# Patient Record
Sex: Male | Born: 1985 | Race: Black or African American | Hispanic: No | Marital: Single | Smoking: Current some day smoker
Health system: Southern US, Community
[De-identification: ages and names within clinical notes are randomized; demographics above are authoritative.]

---
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
## 2008-07-05 ENCOUNTER — Emergency Department (HOSPITAL_COMMUNITY): Admission: EM | Admit: 2008-07-05 | Discharge: 2008-07-05 | Payer: Self-pay | Admitting: Family Medicine

## 2010-09-12 ENCOUNTER — Emergency Department (HOSPITAL_COMMUNITY): Payer: Self-pay

## 2010-09-12 ENCOUNTER — Emergency Department (HOSPITAL_COMMUNITY)
Admission: EM | Admit: 2010-09-12 | Discharge: 2010-09-12 | Disposition: A | Discharge status: Home / Self Care | Payer: Self-pay | Attending: Dermatology | Admitting: Dermatology

## 2010-09-12 DIAGNOSIS — R0602 Shortness of breath: Secondary | ICD-10-CM | POA: Insufficient documentation

## 2010-09-12 DIAGNOSIS — IMO0001 Reserved for inherently not codable concepts without codable children: Secondary | ICD-10-CM | POA: Insufficient documentation

## 2010-09-12 DIAGNOSIS — R079 Chest pain, unspecified: Secondary | ICD-10-CM | POA: Insufficient documentation

## 2010-09-12 DIAGNOSIS — R51 Headache: Secondary | ICD-10-CM | POA: Insufficient documentation

## 2010-09-12 DIAGNOSIS — R070 Pain in throat: Secondary | ICD-10-CM | POA: Insufficient documentation

## 2010-09-12 DIAGNOSIS — B9789 Other viral agents as the cause of diseases classified elsewhere: Secondary | ICD-10-CM | POA: Insufficient documentation

## 2010-09-12 LAB — RAPID STREP SCREEN (MED CTR MEBANE ONLY): Streptococcus, Group A Screen (Direct): NEGATIVE

## 2013-09-13 ENCOUNTER — Emergency Department (HOSPITAL_COMMUNITY)
Admission: EM | Admit: 2013-09-13 | Discharge: 2013-09-13 | Disposition: A | Discharge status: Home / Self Care | Payer: Self-pay | Attending: Emergency Medicine | Admitting: Emergency Medicine

## 2013-09-13 ENCOUNTER — Encounter (HOSPITAL_COMMUNITY): Payer: Self-pay | Admitting: Emergency Medicine

## 2013-09-13 DIAGNOSIS — Z87891 Personal history of nicotine dependence: Secondary | ICD-10-CM | POA: Insufficient documentation

## 2013-09-13 DIAGNOSIS — S025XXB Fracture of tooth (traumatic), initial encounter for open fracture: Secondary | ICD-10-CM

## 2013-09-13 DIAGNOSIS — X58XXXA Exposure to other specified factors, initial encounter: Secondary | ICD-10-CM | POA: Insufficient documentation

## 2013-09-13 DIAGNOSIS — Y939 Activity, unspecified: Secondary | ICD-10-CM | POA: Insufficient documentation

## 2013-09-13 DIAGNOSIS — S025XXA Fracture of tooth (traumatic), initial encounter for closed fracture: Secondary | ICD-10-CM | POA: Insufficient documentation

## 2013-09-13 DIAGNOSIS — Y929 Unspecified place or not applicable: Secondary | ICD-10-CM | POA: Insufficient documentation

## 2013-09-13 MED ORDER — IBUPROFEN 800 MG PO TABS: 800.0000 mg | ORAL_TABLET | Freq: Three times a day (TID) | ORAL | Status: DC

## 2013-09-13 MED ORDER — OXYCODONE-ACETAMINOPHEN 5-325 MG PO TABS: 1.0000 | ORAL_TABLET | ORAL | Status: DC | PRN

## 2013-09-13 MED ORDER — PENICILLIN V POTASSIUM 500 MG PO TABS: 500.0000 mg | ORAL_TABLET | Freq: Four times a day (QID) | ORAL | Status: AC

## 2013-09-13 NOTE — Discharge Instructions (Signed)
Call for a follow up appointment with a Family or Primary Care Provider.  Return if Symptoms worsen.   Take medication as prescribed.   You have a dental injury. Use the resource guide listed below to help you find a dentist if you do not already have one to followup with. It is very important that you get evaluated by a dentist as soon as possible. Call tomorrow to schedule an appointment. Use your pain medication as prescribed and do not operate heavy machinery while on pain medication. Note that your pain medication contains acetaminophen (Tylenol) & its is not reccommended that you use additional acetaminophen (Tylenol) while taking this medication. Take your full course of antibiotics. Read the instructions below.  Eat a soft or liquid diet and rinse your mouth out after meals with warm water. You should see a dentist or return here at once if you have increased swelling, increased pain or uncontrolled bleeding from the site of your injury.   SEEK MEDICAL CARE IF:   You have increased pain not controlled with medicines.   You have swelling around your tooth, in your face or neck.   You have bleeding which starts, continues, or gets worse.   You have a fever >101  If you are unable to open your mouth  RESOURCE GUIDE  Dental Problems  Patients with Medicaid: [facility]                     [facility] [phone]                                           [phone] Phone:  [phone]                                                  Phone:  [phone]  If unable to pay or uninsured, contact:  [facility] or [facility] to become qualified for the adult dental clinic.  Chronic Pain Problems Contact [facility]  [phone] Patients need to be referred by their primary care doctor.  Insufficient Money for Medicine Contact [employer]:  call "211" or [facility] [phone].  No Primary Care Doctor Call  [facility]  [phone] Other agencies that provide inexpensive medical care    [facility]  [phone]    [facility]  [phone]    [facility]  [phone]    [facility]  [phone]    [facility]  [phone]    [facility]  [phone]  [facility]  [phone] [facility]  [phone] [facility]   [phone] (emergency services [phone])  Substance Abuse Resources [facility]  [phone] [facility] [phone] [facility] [phone] [facility] [phone] Residential & Outpatient Substance Abuse Program  [phone]  Abuse/Neglect [facility] Hotline [phone] [facility] Hot[facility] ([phone] (After Hours)  Emergency Shelter [facility] [phone]  [facility] [phone] [facility] [phone]  MRSA Hotline #:   941-427-4334726 367 6565    [facility]  [facility] [place]     [employer]                          [facility] °[address] [place]                       [address]      [address]  °[place]                                                [place]                            [place] °Phone:  [phone]                                   Phone:  [phone]                 Phone:  342-8140 ° °[facility] °Phone:  342-8316 ° °[place] Child Abuse Hotline °([phone] 342-1394 °([phone]) [phone] (After Hours) ° °

## 2013-09-13 NOTE — ED Provider Notes (Signed)
CSN: [id number]     Arrival date & time 09/13/13  1024 History  This chart was scribed for non-physician practitioner, Mellody DrownLauren Toby Ayad, PA-C working with Gilda Creasehristopher J. Pollina, MD by Greggory StallionKayla Andersen, ED scribe. This patient was seen in room TR07C/TR07C and the patient's care was started at 10:51 AM.   Chief Complaint  Patient presents with  . Dental Pain   HPI Comments: Tim Ford is a 28 y.o. male who presents to the Emergency Department complaining of right lower dental pain that started around 4:30 AM today. Denies dental injury but states his tooth broke earlier this morning. Pt has taken ibuprofen and tylenol with no relief. States he has also used orajel with little relief. He does not have a dentist. Denies fever, chills.   The history is provided by the patient. No language interpreter was used.    History reviewed. No pertinent past medical history. History reviewed. No pertinent past surgical history. No family history on file. History  Substance Use Topics  . Smoking status: Former Games developermoker  . Smokeless tobacco: Not on file  . Alcohol Use: No    Review of Systems  Constitutional: Negative for fever and chills.  HENT: Positive for dental problem. Negative for facial swelling.   All other systems reviewed and are negative.  Allergies  Coconut oil; Peanuts; and Sesame seed  Home Medications   Prior to Admission medications   Medication Sig Start Date End Date Taking? Authorizing Provider  acetaminophen (TYLENOL) 500 MG tablet Take 1,000 mg by mouth daily as needed for mild pain.   Yes Historical Provider, MD  benzocaine (ORAJEL) 10 % mucosal gel Use as directed 1 application in the mouth or throat as needed for mouth pain.   Yes Historical Provider, MD  ibuprofen (ADVIL,MOTRIN) 200 MG tablet Take 400 mg by mouth daily as needed for mild pain.   Yes Historical Provider, MD   BP 113/69  Pulse 58  Temp(Src) 98.1 F (36.7 C) (Oral)  SpO2 100%  Physical Exam  Nursing  note and vitals reviewed. Constitutional: He is oriented to person, place, and time. He appears well-developed and well-nourished.  Non-toxic appearance. He does not have a sickly appearance. He does not appear ill. No distress.  HENT:  Head: Normocephalic and atraumatic.  Mouth/Throat: Uvula is midline. No trismus in the jaw.  Tenderness to palpation of tooth # 30,  Large portion of the mesial and occlusal surface missing. Pulp exposed. No signs of peritonsillar or tonsillar abscess. No signs of gingival abscess. Oropharynx is clear and without exudates. Soft non-tender sublingual mucosa, no tongue elevation, no edema to sublingual space, normal voice. Airway patent.  Eyes: Conjunctivae and EOM are normal.  Neck: Neck supple.  Cardiovascular: Normal rate.   Pulmonary/Chest: Effort normal. No respiratory distress.  Musculoskeletal: Normal range of motion.  Neurological: He is alert and oriented to person, place, and time.  Skin: Skin is warm and dry. He is not diaphoretic.  Psychiatric: He has a normal mood and affect. His behavior is normal.    ED Course  Dental Date/Time: 09/13/2013 10:45 AM Performed by: Mellody DrownPARKER, Tarae Wooden Authorized by: Mellody DrownPARKER, Evangelene Vora Consent: Verbal consent obtained. Risks and benefits: risks, benefits and alternatives were discussed Consent given by: patient Patient understanding: patient states understanding of the procedure being performed Patient consent: the patient's understanding of the procedure matches consent given Required items: required blood products, implants, devices, and special equipment available Patient identity confirmed: verbally with patient Local anesthesia used: Pt declined. local  gel. Patient sedated: no Patient tolerance: Patient tolerated the procedure well with no immediate complications. Comments: Paste applied to fractured tooth.   (including critical care time)  COORDINATION OF CARE: 10:52 AM-Discussed treatment plan which includes  dental block with pt at bedside and pt agreed to plan. Will give pt dental referrals and advised him to follow up.   Labs Review Labs Reviewed - No data to display  Imaging Review No results found.   EKG Interpretation None      MDM   Final diagnoses:  Fracture, tooth, open, initial encounter   Patient presents with fracture of tooth, patient declined local anesthetic with dental paste. Patient discharged with antibiotics pain medication and dental references. No gross abscess.  Exam unconcerning for Ludwig's angina or spread of infection.  Meds given in ED:  Medications - No data to display  Discharge Medication List as of 09/13/2013 11:32 AM    START taking these medications   Details  !! ibuprofen (ADVIL,MOTRIN) 800 MG tablet Take 1 tablet (800 mg total) by mouth 3 (three) times daily with meals., Starting 09/13/2013, Until Discontinued, Print    oxyCODONE-acetaminophen (PERCOCET) 5-325 MG per tablet Take 1 tablet by mouth every 4 (four) hours as needed., Starting 09/13/2013, Until Discontinued, Print    penicillin v potassium (VEETID) 500 MG tablet Take 1 tablet (500 mg total) by mouth 4 (four) times daily., Starting 09/13/2013, Last dose on Mon 09/20/13, Print     !! - Potential duplicate medications found. Please discuss with provider.      I personally performed the services described in this documentation, which was scribed in my presence. The recorded information has been reviewed and is accurate.  Mellody Drown, PA-C 09/14/13 [phone]

## 2013-09-13 NOTE — ED Notes (Signed)
Patient states R lower molar broken.  Patient states pain started this morning about 430a.m.   Patient states he took ibuprofen and tylenol at home but didn't help.  Patient states he doesn't have insurance so hasn't been to a dentist.

## 2013-09-16 NOTE — ED Provider Notes (Signed)
Medical screening examination/treatment/procedure(s) were performed by non-physician practitioner and as supervising physician I was immediately available for consultation/collaboration.  Nhan Qualley J. Jaycee Pelzer, MD 09/16/13 1749 

## 2013-12-02 ENCOUNTER — Encounter (HOSPITAL_COMMUNITY): Payer: Self-pay | Admitting: Emergency Medicine

## 2013-12-02 ENCOUNTER — Emergency Department (HOSPITAL_COMMUNITY)
Admission: EM | Admit: 2013-12-02 | Discharge: 2013-12-02 | Disposition: A | Discharge status: Home / Self Care | Payer: Self-pay | Attending: Emergency Medicine | Admitting: Emergency Medicine

## 2013-12-02 DIAGNOSIS — K029 Dental caries, unspecified: Secondary | ICD-10-CM | POA: Insufficient documentation

## 2013-12-02 DIAGNOSIS — K0889 Other specified disorders of teeth and supporting structures: Secondary | ICD-10-CM

## 2013-12-02 DIAGNOSIS — Z720 Tobacco use: Secondary | ICD-10-CM | POA: Insufficient documentation

## 2013-12-02 DIAGNOSIS — G8929 Other chronic pain: Secondary | ICD-10-CM | POA: Insufficient documentation

## 2013-12-02 DIAGNOSIS — K088 Other specified disorders of teeth and supporting structures: Secondary | ICD-10-CM | POA: Insufficient documentation

## 2013-12-02 MED ORDER — AMOXICILLIN 500 MG PO CAPS: 500.0000 mg | ORAL_CAPSULE | Freq: Three times a day (TID) | ORAL | Status: DC

## 2013-12-02 MED ORDER — HYDROCODONE-ACETAMINOPHEN 5-325 MG PO TABS
1.0000 | ORAL_TABLET | Freq: Once | ORAL | Status: AC
  Administered 2013-12-02: 1 via ORAL
  Filled 2013-12-02: qty 1

## 2013-12-02 MED ORDER — HYDROCODONE-ACETAMINOPHEN 5-325 MG PO TABS: 1.0000 | ORAL_TABLET | Freq: Four times a day (QID) | ORAL | Status: DC | PRN

## 2013-12-02 NOTE — ED Provider Notes (Signed)
Medical screening examination/treatment/procedure(s) were performed by non-physician practitioner and as supervising physician I was immediately available for consultation/collaboration.   EKG Interpretation None       Brennah Quraishi M Tynika Luddy, MD 12/02/13 0603 

## 2013-12-02 NOTE — Discharge Instructions (Signed)
Dental Caries °Dental caries (also called tooth decay) is the most common oral disease. It can occur at any age but is more common in children and young adults.  °HOW DENTAL CARIES DEVELOPS  °The process of decay begins when bacteria and foods (particularly sugars and starches) combine in your mouth to produce plaque. Plaque is a substance that sticks to the hard, outer surface of a tooth (enamel). The bacteria in plaque produce acids that attack enamel. These acids may also attack the root surface of a tooth (cementum) if it is exposed. Repeated attacks dissolve these surfaces and create holes in the tooth (cavities). If left untreated, the acids destroy the other layers of the tooth.  °RISK FACTORS °· Frequent sipping of sugary beverages.   °· Frequent snacking on sugary and starchy foods, especially those that easily get stuck in the teeth.   °· Poor oral hygiene.   °· Dry mouth.   °· Substance abuse such as methamphetamine abuse.   °· Broken or poor-fitting dental restorations.   °· Eating disorders.   °· Gastroesophageal reflux disease (GERD).   °· Certain radiation treatments to the head and neck. °SYMPTOMS °In the early stages of dental caries, symptoms are seldom present. Sometimes white, chalky areas may be seen on the enamel or other tooth layers. In later stages, symptoms may include: °· Pits and holes on the enamel. °· Toothache after sweet, hot, or cold foods or drinks are consumed. °· Pain around the tooth. °· Swelling around the tooth. °DIAGNOSIS  °Most of the time, dental caries is detected during a regular dental checkup. A diagnosis is made after a thorough medical and dental history is taken and the surfaces of your teeth are checked for signs of dental caries. Sometimes special instruments, such as lasers, are used to check for dental caries. Dental X-ray exams may be taken so that areas not visible to the eye (such as between the contact areas of the teeth) can be checked for cavities.    °TREATMENT  °If dental caries is in its early stages, it may be reversed with a fluoride treatment or an application of a remineralizing agent at the dental office. Thorough brushing and flossing at home is needed to aid these treatments. If it is in its later stages, treatment depends on the location and extent of tooth destruction:  °· If a small area of the tooth has been destroyed, the destroyed area will be removed and cavities will be filled with a material such as gold, silver amalgam, or composite resin.   °· If a large area of the tooth has been destroyed, the destroyed area will be removed and a cap (crown) will be fitted over the remaining tooth structure.   °· If the center part of the tooth (pulp) is affected, a procedure called a root canal will be needed before a filling or crown can be placed.   °· If most of the tooth has been destroyed, the tooth may need to be pulled (extracted). °HOME CARE INSTRUCTIONS °You can prevent, stop, or reverse dental caries at home by practicing good oral hygiene. Good oral hygiene includes: °· Thoroughly cleaning your teeth at least twice a day with a toothbrush and dental floss.   °· Using a fluoride toothpaste. A fluoride mouth rinse may also be used if recommended by your dentist or health care provider.   °· Restricting the amount of sugary and starchy foods and sugary liquids you consume.   °· Avoiding frequent snacking on these foods and sipping of these liquids.   °· Keeping regular visits with   a dentist for checkups and cleanings. °PREVENTION  °· Practice good oral hygiene. °· Consider a dental sealant. A dental sealant is a coating material that is applied by your dentist to the pits and grooves of teeth. The sealant prevents food from being trapped in them. It may protect the teeth for several years. °· Ask about fluoride supplements if you live in a community without fluorinated water or with water that has a low fluoride content. Use fluoride supplements  as directed by your dentist or health care provider. °· Allow fluoride varnish applications to teeth if directed by your dentist or health care provider. °Document Released: 10/13/2001 Document Revised: 06/07/2013 Document Reviewed: 01/24/2012 °ExitCare® Patient Information ©2015 [employer]. This information is not intended to replace advice given to you by your health care provider. Make sure you discuss any questions you have with your health care provider. ° °Dental Extraction °A dental extraction procedure refers to a routine tooth extraction performed by your dentist. The procedure depends on where and how the tooth is positioned. The procedure can be very quick, sometimes lasting only seconds. Reasons for dental extraction include: °· Tooth decay. °· Infections (abscesses). °· The need to make room for other teeth. °· Gum diseases where the supporting bone has been destroyed. °· Fractures of the tooth leaving it unrestorable. °· Extra teeth (supernumerary) or grossly malformed teeth. °· Baby teeth that have not fallen out in time and have not permitted the the permanent teeth to erupt properly. °· In preparation for braces where there is not enough room to align the teeth properly. °· Not enough room for wisdom teeth (particularly those that are impacted). °· Prior to receiving radiation to the head and neck, teeth in the field of radiation may need to be extracted. °LET YOUR CAREGIVER KNOW ABOUT: °· Any allergies. °· All medicines you are taking: °¨ Including herbs, eye drops, over-the-counter medications, and creams. °¨ Blood thinners (anticoagulants), aspirin, or other drugs that may affect blood clotting. °· Use of steroids (through mouth or as creams). °· Previous problems with anesthetics, including local anesthetics. °· History of bleeding or blood problems. °· Previous surgery. °· Possibility of pregnancy if this applies. °· Smoking history. °· Any health problems. °RISKS AND COMPLICATIONS °As with  any procedure, complications may occur, but they can usually be managed by your caregiver. General surgical complications may include: °· Reaction to anesthesia. °· Damage to surrounding teeth, nerves, tissues, or structures. °· Infection. °· Bleeding. ° With appropriate treatment and care after surgery, the following complications are very uncommon: °· Dry socket (blood clot does not form or stay in place over empty socket). This can delay healing. °· Incomplete extraction of roots. °· Jawbone injury, pain, or weakness. °BEFORE THE PROCEDURE °·  Your dental care provider will: °¨ Take a medical and dental history. °¨ Take an X-ray to evaluate the circumstances and how to best extract the tooth. °¨ Do an oral exam. °· Depending on the situation, antibiotics may be given before or after the extraction. °· Your caregivers may review the procedure, the local anesthesia and/or sedation being used, and what to expect after the procedure with you. °· If needed, your dentist may give you a form of sedation, either by medicine you swallow, gas, or intravenously (IV). This will help to relieve anxiety. Complicated extractions may require the use of general anesthesia. ° It is important to follow your caregiver's instructions prior to your procedure to avoid complications. Steps before your procedure may include: °· Alert your caregiver if   you feel ill (sore throat, fever, upset stomach, etc.) in the days leading up to your procedure. °· Stop taking certain medications for several days prior to your procedure such as blood thinners. °· Take certain medications, such as antibiotics. °· Avoid eating and drinking for several hours before the procedure. This will help you to avoid complications from the sedation or anesthesia. °· Sign a patient consent form. °· Have a friend or family member drive you to the dentist and drive you home after the procedure. °· Wear comfortable, loose clothing. Limit makeup and jewelry. °· Quit  smoking. If you are a smoker, this will raise the chances of a healing problem after your procedure. If you are thinking about quitting, talk to your surgeon about how long before the operation you should stop smoking. You may also get help from your primary caregiver. °PROCEDURE °Dental extraction is typically done as an outpatient procedure. IV sedation, local anesthesia, or both may be used. It will keep you comfortable and free of pain during the procedure.  °There are 2 types of extractions: °· Simple extraction involves a tooth that is visible in the mouth and above the gum line. After local anesthetic is given by injection, and the area is numbed, the dentist will loosen the tooth with a special instrument (elevator). Then another instrument (forceps) will be used to grasp the tooth and remove it from its socket. During the procedure you will feel some pressure, but you should not feel pain. If you do feel pain, tell your dentist. The open socket will be cleaned. Dressings (gauze) will be placed in the socket to reduce bleeding. °· Surgical extractions are used if the tooth has not come into the mouth or the tooth is broken off below the gum line. The dentist will make a cut (incision) in the gum and may have to remove some of the bone around the tooth to aid in the removal of the tooth. After removal, stitches (sutures) may be required to close the area to help in healing and control bleeding. For some surgical extractions, you may need a general anesthetic or IV sedation (through the vein). °After both types of extractions, you may be given pain medication or other drugs to help healing. Other postoperative instructions will be given by your dental caregiver.  °AFTER THE PROCEDURE °· You will have gauze in your mouth where the tooth was removed. Gentle pressure on the gauze for up to 1 hour will help to control bleeding. °· A blood clot will begin to form over the open socket. This is normal. Do not touch  the area or rinse it. °· Your pain will be controlled with medication and self-care. °· You will be given detailed instructions for care after surgery. °PROGNOSIS °While some discomfort is normal after tooth extraction, most patients recover fully in just a few days. °SEEK IMMEDIATE DENTAL CARE °· You have uncontrolled bleeding, marked swelling, or severe pain. °· You develop a fever, difficulty swallowing, or other severe symptoms. °· You have questions or concerns. °Document Released: 01/21/2005 Document Revised: 06/07/2013 Document Reviewed: 04/27/2010 °ExitCare® Patient Information ©2015 [employer]. This information is not intended to replace advice given to you by your health care provider. Make sure you discuss any questions you have with your health care provider. ° ° °Emergency Department Resource Guide °1) Find a Doctor and Pay Out of Pocket °Although you won't have to find out who is covered by your insurance plan, it is a good idea to   ask around and get recommendations. You will then need to call the office and see if the doctor you have chosen will accept you as a new patient and what types of options they offer for patients who are self-pay. Some doctors offer discounts or will set up payment plans for their patients who do not have insurance, but you will need to ask so you aren't surprised when you get to your appointment. ° °2) Contact Your Local Health Department °Not all health departments have doctors that can see patients for sick visits, but many do, so it is worth a call to see if yours does. If you don't know where your local health department is, you can check in your phone book. The CDC also has a tool to help you locate your state's health department, and many state websites also have listings of all of their local health departments. ° °3) Find a Walk-in Clinic °If your illness is not likely to be very severe or complicated, you may want to try a walk in clinic. These are popping up all  over the country in pharmacies, drugstores, and shopping centers. They're usually staffed by nurse practitioners or physician assistants that have been trained to treat common illnesses and complaints. They're usually fairly quick and inexpensive. However, if you have serious medical issues or chronic medical problems, these are probably not your best option. ° °No Primary Care Doctor: °- Call [facility] at  832-8000 - they can help you locate a primary care doctor that  accepts your insurance, provides certain services, etc. °- Physician Referral Service- 1-800-533-3463 ° °Chronic Pain Problems: °Organization         Address  Phone   Notes  °[facility]  (336) 297-2271 Patients need to be referred by their primary care doctor.  ° °Medication Assistance: °Organization         Address  Phone   Notes  °[place] [facility] [address] °[place], [place] [address] (336) 641-8030 --Must be a resident of [place] °-- Must have NO insurance coverage whatsoever (no Medicaid/ Medicare, etc.) °-- The pt. MUST have a primary care doctor that directs their care regularly and follows them in the community °  °[facility]  (866) 331-1348   °[employer]  (888) 892-1162   ° °Agencies that provide inexpensive medical care: °Organization         Address  Phone   Notes  °[facility]  (336) 832-8035   °[facility]    (336) 832-7272   °[facility] [address] °[place], [place] [address] (336) 832-4777   °[facility] [address], °[place] (336) 271-4999   °[facility]    (336) 373-0678   °[facility]    (336) 272-1050   °[facility] ° [address], [place] Phone:  (336) 832-4444, Fax:  (336) 832-4440 Hours of Operation:  9 am - 6 pm, M-F.  Also accepts Medicaid/Medicare and self-pay.  °[facility] ° [address], [place] Phone: (336) [phone], Fax: (336) [phone]. Hours of Operation:  8:30 am - 5:30 pm, M-F.  Also accepts Medicaid and self-pay.  °[facility] [place] [address], [place] Phone: (336) 878-6027   °[facility] [address], [place], [place] (336)723-1848, Ext. 123 Mondays & Thursdays: 7-9 AM.  First 15 patients are   seen on a first come, first serve basis. °  ° °Medicaid-accepting [place] Providers: ° °Organization         Address  Phone   Notes  °[facility] [address], [place] (336) 641-2100 Also accepts self-pay patients.  °[facility] [address], [place] ° (336) 856-9996   °[facility] [address], [place] (336) 288-8857   °[facility] [address] [place] [address], [place] (336) 299-7000   °Veita Bland [address], [place]  ° (336) 373-1557 Only accepts [place] Access Medicaid patients after they have their name applied to their card.  ° °Self-Pay (no insurance) in [place]: ° °Organization         Address  Phone   Notes  °Sickle Cell Patients, [facility] [address], [place] (336) 832-1970   °[facility] [address], [place] (336) 832-4400   °[facility] ° [address], [place] (336) 992-4800   °[facility]/Dr. Osei-Bonsu ° [address] [place] [address], [place] or [address], [place] (336) [phone] Phone number for both [place] and [place] locations is the same.  °[facility] [address], [place] (336) 299-0000   °[facility] [address] [place] [address], [place] or [address] (336) 852-7530 °(336) 878-2260   °[facility] [address], [place] (336) 350-1642, phone; (336) 294-5005, fax Sees patients 1st and 3rd Saturday of every  month.  Must not qualify for public or private insurance (i.e. Medicaid, Medicare, [place] Health Choice, Veterans' Benefits) • Household income should be no more than 200% of the poverty level •The clinic cannot treat you if you are pregnant or think you are pregnant • Sexually transmitted diseases are not treated at the clinic.  ° ° °Dental Care: °Organization         Address  Phone  Notes  °[facility] [address], [place] (336) 641-6152 Accepts children up to age 21 who are enrolled in Medicaid or [place] Health Choice; pregnant women with a Medicaid card; and children who have applied for Medicaid or [place] Health Choice, but were declined, whose parents can pay a reduced fee at time of service.  °[facility] [place]  [address], [place] (336) 641-7733 Accepts children up to age 21 who are enrolled in Medicaid or [place] Health Choice; pregnant women with a Medicaid card; and children who have applied for Medicaid or [place] Health Choice, but were declined, whose parents can pay a reduced fee at time of service.  °[facility] ° [address], [place] (336) 641-4533 Patients are seen by appointment only. Walk-ins are not accepted. [facility] will see patients 18 years of age and older. °Monday - Tuesday (8am-5pm) °Most Wednesdays (8:30-5pm) °$30 per visit, cash only  °[facility] ° [address], [place] (336) 641-4533 Patients are seen by appointment only. Walk-ins are not accepted. [facility] will see patients 18 years of age and older. °One Wednesday Evening (Monthly: Volunteer Based).  $30 per visit, cash only  °[facility]  (919) [phone] for adults; Children under age 4, call Graduate Pediatric Dentistry at ([phone]) [phone].   Children aged 4-14, please call ([phone]) [phone] to request a pediatric application. ° Dental  services are provided in all areas of dental care including fillings, crowns and bridges, complete and partial dentures, implants, gum treatment, root canals, and extractions. Preventive care is also provided. Treatment is provided to both adults and children. °Patients are selected via a lottery and there is often a waiting list. °  °[facility] [address], °[place] ° (336) 763-8833 [email or web address] °  °[facility] [address], [place], [place] (336)723-1848, Ext. 123 Second and Fourth Thursday of each month, opens at 6:30 AM; Clinic ends at 9 AM.  Patients are seen on a first-come first-served basis, and a limited number are seen during each clinic.  ° °[facility] ° [address], [place], [place] (336) 723-7904   Eligibility Requirements °You must have lived in [place], [place], or [place] counties for at least the last three months. °  You cannot be eligible for state or federal sponsored healthcare insurance, including Veterans Administration, Medicaid, or Medicare. °  You generally cannot be eligible for healthcare insurance through your employer.  °  How to apply: °Eligibility screenings are held every Tuesday and Wednesday afternoon from 1:00 pm until 4:00 pm. You do not need an appointment for the interview!  °[facility] [address], [place], [place] 336-631-2330   °[facility]  336-342-8273   °[facility]  336-703-3100   °[facility]  336-570-6415   ° °Behavioral Health Resources in the Community: °Intensive Outpatient Programs °Organization         Address  Phone  Notes  °[facility] [address], [place], [place] 336-878-6098   °[facility] [address], [place], [place] 336-832-9800   °[facility] [address], [place], [place] ° 336-882-2125   °[facility] [address],   °[place], [place] 1-800-853-5163 or 336-641-4981   °Substance Abuse Resources °Organization         Address  Phone  Notes  °[facility]  336-882-2125   °[facility]  336-784-9470   °[facility]  336-285-9073   °[facility]  336-845-3988   °Residential & Outpatient Substance Abuse Program  1-800-659-3381   °Psychological Services °Organization         Address  Phone  Notes  °[facility]  336- 832-9600   °[facility]  336- 378-7881   °[facility] [address], [place] 1-800-853-5163 or 336-641-4981   ° °Mobile Crisis Teams °Organization         Address  Phone  Notes  °[facility], Mobile Crisis Care Unit  1-877-626-1772   °Assertive °[facility] ° [address] [place], [place] 336-834-9664   °Sharon DeEsch [address] °[place] 336-554-5454   ° °Self-Help/Support Groups °Organization         Address  Phone             Notes  °[facility] - variety of support groups  336- 373-1402 Call for more information  °Narcotics Anonymous (NA), [facility] [address], °[place]  2 meetings at this location  ° °Residential Treatment Programs °Organization         Address  Phone  Notes  °[facility] [address],    °[place]  1-866-801-8205   °  [facility] ° [address], [place], [place] 704-293-8524   °[facility] [address], [place] 336-845-3988 Admissions: 8am-3pm M-F  °[facility] [address]    °[place], [place] 336-841-1104   °[facility] [address], [place], [place] 336-379-7146   °[facility] [address]  °[place], [place] 336-285-9073   °[facility] Intensive Outpatient [address], [place], [place] 336-852-3033   °[facility] [address]  °[place], [place]  1-877-615-2722 or 336-784-9470   °[facility] [address] [place], [place] 336-227-7417 Accepts Medicaid  °[facility] [address]  °[place] 1-800-659-3381 Substance Abuse/Addiction Treatment  ° °[place] Behavioral Health Resources °Organization         Address  Phone  Notes  °[facility]  (888) 581-9988   °Julie Brannon, PhD [address] [place], [place]   (336) 349-5553 or (336) 951-0000   °[facility]   [address] °[place], [place] (336) 349-4454   °[facility] [address], [place], [place] (336) 342-8316 Insurance/Medicaid/sponsorship through [facility]  °[facility] [address]                                    [place], [place] (336) 342-8316 Therapy/tele-psych/case  °[facility] [address]  ° [place], [place] (336) 349-2233    °Dr. Arfeen  (336) 349-4544   °[facility] [place]  [employer] [place] [facility] 1) [address],  °2) [address], [place] °3)  [address], [place] (336) 349-3220 °(336) 342-7768 ° °(336) 342-8140   °[place] [facility] (336) 342-1394 or (336) 342-3537 (After Hours)    ° ° ° °

## 2013-12-02 NOTE — ED Notes (Signed)
Pt is c/o toothache on the bottom right  Sxs started Wednesday around 5pm

## 2013-12-02 NOTE — ED Provider Notes (Signed)
CSN: [id number]     Arrival date & time 12/02/13  [id number] History   First MD Initiated Contact with Patient 12/02/13 [phone]     Chief Complaint  Patient presents with  . Dental Pain     (Consider location/radiation/quality/duration/timing/severity/associated sxs/prior Treatment) Patient is a 28 y.o. male presenting with tooth pain. The history is provided by the patient. No language interpreter was used.  Dental Pain Location:  Lower Lower teeth location:  30/RL 1st molar Quality:  Burning and aching Severity:  Severe Onset quality:  Gradual Duration:  2 months Timing:  Intermittent Progression:  Worsening Chronicity:  Chronic Context: dental caries, dental fracture and poor dentition   Context: not abscess, cap still on, not crown fracture, not enamel fracture, filling still in place, not intrusion, not malocclusion, not recent dental surgery and not trauma   Relieved by:  Nothing Worsened by:  Cold food/drink, hot food/drink, touching, jaw movement and pressure Ineffective treatments:  NSAIDs and topical anesthetic gel Associated symptoms: facial pain and gum swelling   Associated symptoms: no congestion, no difficulty swallowing, no drooling, no facial swelling, no fever, no headaches, no neck pain, no neck swelling, no oral bleeding, no oral lesions and no trismus   Risk factors: lack of dental care and smoking   Risk factors: no alcohol problem, no cancer, no chewing tobacco use, no diabetes, no immunosuppression and no periodontal disease     History reviewed. No pertinent past medical history. History reviewed. No pertinent past surgical history. Family History  Problem Relation Age of Onset  . Diabetes Other    History  Substance Use Topics  . Smoking status: Current Some Day Smoker  . Smokeless tobacco: Not on file  . Alcohol Use: Yes     Comment: occ    Review of Systems  Constitutional: Negative for fever.  HENT: Negative for congestion, drooling, facial  swelling and mouth sores.   Gastrointestinal: Negative for nausea and vomiting.  Musculoskeletal: Negative for neck pain.  Neurological: Negative for headaches.  All other systems reviewed and are negative.     Allergies  Coconut oil; Peanuts; and Sesame seed  Home Medications   Prior to Admission medications   Medication Sig Start Date End Date Taking? Authorizing Provider  ibuprofen (ADVIL,MOTRIN) 200 MG tablet Take 400 mg by mouth daily as needed for mild pain.   Yes Historical Provider, MD  amoxicillin (AMOXIL) 500 MG capsule Take 1 capsule (500 mg total) by mouth 3 (three) times daily. 12/02/13   Chriselda Leppert A Forcucci, PA-C  HYDROcodone-acetaminophen (NORCO/VICODIN) 5-325 MG per tablet Take 1-2 tablets by mouth every 6 (six) hours as needed for moderate pain or severe pain. 12/02/13   Rayshon Albaugh A Forcucci, PA-C   BP 132/73  Pulse 60  Temp(Src) 98 F (36.7 C) (Oral)  Resp 18  Ht 5\' 11"  (1.803 m)  Wt 170 lb (77.111 kg)  BMI 23.72 kg/m2  SpO2 100% Physical Exam  Nursing note and vitals reviewed. Constitutional: He appears well-developed and well-nourished. No distress.  HENT:  Head: Normocephalic and atraumatic.  Mouth/Throat: Uvula is midline, oropharynx is clear and moist and mucous membranes are normal. No oral lesions. No trismus in the jaw. Dental caries present. No dental abscesses or uvula swelling. No oropharyngeal exudate.    Eyes: Conjunctivae and EOM are normal. Pupils are equal, round, and reactive to light. No scleral icterus.  Neck: Normal range of motion. Neck supple. No JVD present. No thyromegaly present.  Cardiovascular: Normal rate, regular rhythm, normal  heart sounds and intact distal pulses.  Exam reveals no gallop and no friction rub.   No murmur heard. Pulmonary/Chest: Effort normal and breath sounds normal. No respiratory distress. He has no wheezes. He has no rales. He exhibits no tenderness.  Lymphadenopathy:    He has no cervical adenopathy.    Skin: He is not diaphoretic.    ED Course  Procedures (including critical care time) Labs Review Labs Reviewed - No data to display  Imaging Review No results found.   EKG Interpretation None      MDM   Final diagnoses:  Pain, dental  Dental caries   Patient is a 28 y.o. Male with dental pain.  Physical exam reveals no evidence of ludwigs angina or trismus.  There are multiple dental caries which have resulted in a dental fracture.  Patient offered dental block which he declined.  Will give hydrocodone and amoxicillin.  Patient to be given dental resource list.  Patient to return for ludwigs angina symptoms or trismus.  Patient states understanding and agreement.  Patient is stable for discharge.      Eben Burowourtney A Forcucci, PA-C 12/02/13 [phone]

## 2014-03-08 ENCOUNTER — Encounter (HOSPITAL_COMMUNITY): Payer: Self-pay

## 2014-03-08 ENCOUNTER — Emergency Department (HOSPITAL_COMMUNITY)
Admission: EM | Admit: 2014-03-08 | Discharge: 2014-03-08 | Disposition: A | Discharge status: Home / Self Care | Payer: Self-pay | Attending: Emergency Medicine | Admitting: Emergency Medicine

## 2014-03-08 DIAGNOSIS — Z792 Long term (current) use of antibiotics: Secondary | ICD-10-CM | POA: Insufficient documentation

## 2014-03-08 DIAGNOSIS — S79912A Unspecified injury of left hip, initial encounter: Secondary | ICD-10-CM | POA: Insufficient documentation

## 2014-03-08 DIAGNOSIS — Y9389 Activity, other specified: Secondary | ICD-10-CM | POA: Insufficient documentation

## 2014-03-08 DIAGNOSIS — Z791 Long term (current) use of non-steroidal anti-inflammatories (NSAID): Secondary | ICD-10-CM | POA: Insufficient documentation

## 2014-03-08 DIAGNOSIS — Z720 Tobacco use: Secondary | ICD-10-CM | POA: Insufficient documentation

## 2014-03-08 DIAGNOSIS — S161XXA Strain of muscle, fascia and tendon at neck level, initial encounter: Secondary | ICD-10-CM | POA: Insufficient documentation

## 2014-03-08 DIAGNOSIS — T148XXA Other injury of unspecified body region, initial encounter: Secondary | ICD-10-CM

## 2014-03-08 DIAGNOSIS — Y92410 Unspecified street and highway as the place of occurrence of the external cause: Secondary | ICD-10-CM | POA: Insufficient documentation

## 2014-03-08 DIAGNOSIS — Y998 Other external cause status: Secondary | ICD-10-CM | POA: Insufficient documentation

## 2014-03-08 MED ORDER — NAPROXEN 500 MG PO TABS: 500.0000 mg | ORAL_TABLET | Freq: Two times a day (BID) | ORAL | Status: DC

## 2014-03-08 MED ORDER — CYCLOBENZAPRINE HCL 10 MG PO TABS: 10.0000 mg | ORAL_TABLET | Freq: Two times a day (BID) | ORAL | Status: DC | PRN
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
## 2014-03-08 NOTE — Discharge Instructions (Signed)

## 2014-05-07 ENCOUNTER — Emergency Department (HOSPITAL_COMMUNITY)
Admission: EM | Admit: 2014-05-07 | Discharge: 2014-05-08 | Disposition: A | Discharge status: Home / Self Care | Payer: Self-pay | Attending: Emergency Medicine | Admitting: Emergency Medicine

## 2014-05-07 ENCOUNTER — Encounter (HOSPITAL_COMMUNITY): Payer: Self-pay | Admitting: Oncology

## 2014-05-07 DIAGNOSIS — Z792 Long term (current) use of antibiotics: Secondary | ICD-10-CM | POA: Insufficient documentation

## 2014-05-07 DIAGNOSIS — S8001XA Contusion of right knee, initial encounter: Secondary | ICD-10-CM | POA: Insufficient documentation

## 2014-05-07 DIAGNOSIS — W06XXXA Fall from bed, initial encounter: Secondary | ICD-10-CM | POA: Insufficient documentation

## 2014-05-07 DIAGNOSIS — Y998 Other external cause status: Secondary | ICD-10-CM | POA: Insufficient documentation

## 2014-05-07 DIAGNOSIS — Y9389 Activity, other specified: Secondary | ICD-10-CM | POA: Insufficient documentation

## 2014-05-07 DIAGNOSIS — Y92092 Bedroom in other non-institutional residence as the place of occurrence of the external cause: Secondary | ICD-10-CM | POA: Insufficient documentation

## 2014-05-07 DIAGNOSIS — Z791 Long term (current) use of non-steroidal anti-inflammatories (NSAID): Secondary | ICD-10-CM | POA: Insufficient documentation

## 2014-05-07 DIAGNOSIS — Z720 Tobacco use: Secondary | ICD-10-CM | POA: Insufficient documentation

## 2014-05-07 NOTE — ED Notes (Signed)
Per pt he fell on his right knee this morning causing severe pain to right knee as well as right hip and thigh.  Pt rates pain 8/10 throbbing in nature.  Pt soaked in epsom salt however has not taken anything for the pain.

## 2014-05-07 NOTE — ED Provider Notes (Signed)
CSN: [id number]     Arrival date & time 05/07/14  2306 History  This chart was scribed for non-physician practitioner, Elpidio AnisShari Dontel Harshberger, PA-C,working with Paula LibraJohn Molpus, MD, by Karle PlumberJennifer Tensley, ED Scribe. This patient was seen in room WTR9/WTR9 and the patient's care was started at 11:52 PM.  Chief Complaint  Patient presents with  . Knee Pain   Patient is a 29 y.o. male presenting with knee pain. The history is provided by the patient and medical records. No language interpreter was used.  Knee Pain   HPI Comments:  Tim Ford is a 29 y.o. male who presents to the Emergency Department complaining of severe right medial knee pain that began this morning secondary to falling out of his bed directly on the right knee. He reports associated right hip and thigh pain. Pt reports an abrasion to the anterior knee as well. Pt has soaked in epsom salt bath but denies taking anything for pain. Touching the area or bearing weight makes the pain worse. He denies numbness, tingling or weakness of the right knee, calf pain or bruising. He is unaware of his last tetanus vaccination.  History reviewed. No pertinent past medical history. History reviewed. No pertinent past surgical history. Family History  Problem Relation Age of Onset  . Diabetes Other    History  Substance Use Topics  . Smoking status: Current Some Day Smoker  . Smokeless tobacco: Not on file  . Alcohol Use: Yes     Comment: occ    Review of Systems  Musculoskeletal: Positive for myalgias and arthralgias.  Skin: Negative for color change.  Neurological: Negative for weakness and numbness.  All other systems reviewed and are negative.   Allergies  Coconut oil; Peanuts; and Sesame seed  Home Medications   Prior to Admission medications   Medication Sig Start Date End Date Taking? Authorizing Provider  amoxicillin (AMOXIL) 500 MG capsule Take 1 capsule (500 mg total) by mouth 3 (three) times daily. 12/02/13   Courtney Forcucci,  PA-C  cyclobenzaprine (FLEXERIL) 10 MG tablet Take 1 tablet (10 mg total) by mouth 2 (two) times daily as needed for muscle spasms. 03/08/14   Gwyneth SproutWhitney Plunkett, MD  HYDROcodone-acetaminophen (NORCO/VICODIN) 5-325 MG per tablet Take 1-2 tablets by mouth every 6 (six) hours as needed for moderate pain or severe pain. 12/02/13   Courtney Forcucci, PA-C  ibuprofen (ADVIL,MOTRIN) 200 MG tablet Take 400 mg by mouth daily as needed for mild pain.    Historical Provider, MD  naproxen (NAPROSYN) 500 MG tablet Take 1 tablet (500 mg total) by mouth 2 (two) times daily. 03/08/14   Gwyneth SproutWhitney Plunkett, MD   Triage Vitals: BP 118/73 mmHg  Pulse 88  Temp(Src) 98.4 F (36.9 C) (Oral)  Resp 20  Ht 5\' 11"  (1.803 m)  Wt 165 lb (74.844 kg)  BMI 23.02 kg/m2  SpO2 96% Physical Exam  Constitutional: He is oriented to person, place, and time. He appears well-developed and well-nourished.  HENT:  Head: Normocephalic and atraumatic.  Eyes: EOM are normal.  Neck: Normal range of motion.  Cardiovascular: Normal rate.   Pulmonary/Chest: Effort normal.  Musculoskeletal: Normal range of motion.  Right knee moderately swollen medially. Joint stable. No patellar tenderness. Calf and thigh nontender. Ambulatory.  Neurological: He is alert and oriented to person, place, and time.  Skin: Skin is warm and dry.  Superficial abrasion noted to proximal anterior shin.  Psychiatric: He has a normal mood and affect. His behavior is normal.  Nursing note  and vitals reviewed.   ED Course  Procedures (including critical care time) DIAGNOSTIC STUDIES: Oxygen Saturation is 96% on RA, adequate by my interpretation.   COORDINATION OF CARE: 11:55 PM- Will X-Ray right knee. Pt verbalizes understanding and agrees to plan.  Medications - No data to display  Labs Review Labs Reviewed - No data to display  Imaging Review No results found.   EKG Interpretation None     Dg Knee Complete 4 Views Right  05/08/2014   CLINICAL DATA:   Larey Seat in bedroom at home. Bruising over patellar region.  EXAM: RIGHT KNEE - COMPLETE 4+ VIEW  COMPARISON:  None.  FINDINGS: There is no evidence of fracture, dislocation, or joint effusion. There is no evidence of arthropathy or other focal bone abnormality. Soft tissues are unremarkable.  IMPRESSION: Negative.   Electronically Signed   By: Ellery Plunk M.D.   On: 05/08/2014 00:37    MDM   Final diagnoses:  None    1. Right knee contusion  Negative imaging after fall to right knee. Will treat supportively with prn ortho follow up.  I personally performed the services described in this documentation, which was scribed in my presence. The recorded information has been reviewed and is accurate.    Elpidio Anis, PA-C 05/08/14 0047  Paula Libra, MD 05/08/14 [phone]

## 2014-05-08 ENCOUNTER — Emergency Department (HOSPITAL_COMMUNITY): Payer: Self-pay

## 2014-05-08 MED ORDER — IBUPROFEN 800 MG PO TABS: 800.0000 mg | ORAL_TABLET | Freq: Three times a day (TID) | ORAL | Status: DC

## 2014-05-08 NOTE — Discharge Instructions (Signed)
Cryotherapy °Cryotherapy means treatment with cold. Ice or gel packs can be used to reduce both pain and swelling. Ice is the most helpful within the first 24 to 48 hours after an injury or flare-up from overusing a muscle or joint. Sprains, strains, spasms, burning pain, shooting pain, and aches can all be eased with ice. Ice can also be used when recovering from surgery. Ice is effective, has very few side effects, and is safe for most people to use. °PRECAUTIONS  °Ice is not a safe treatment option for people with: °· Raynaud phenomenon. This is a condition affecting small blood vessels in the extremities. Exposure to cold may cause your problems to return. °· Cold hypersensitivity. There are many forms of cold hypersensitivity, including: °¨ Cold urticaria. Red, itchy hives appear on the skin when the tissues begin to warm after being iced. °¨ Cold erythema. This is a red, itchy rash caused by exposure to cold. °¨ Cold hemoglobinuria. Red blood cells break down when the tissues begin to warm after being iced. The hemoglobin that carry oxygen are passed into the urine because they cannot combine with blood proteins fast enough. °· Numbness or altered sensitivity in the area being iced. °If you have any of the following conditions, do not use ice until you have discussed cryotherapy with your caregiver: °· Heart conditions, such as arrhythmia, angina, or chronic heart disease. °· High blood pressure. °· Healing wounds or open skin in the area being iced. °· Current infections. °· Rheumatoid arthritis. °· Poor circulation. °· Diabetes. °Ice slows the blood flow in the region it is applied. This is beneficial when trying to stop inflamed tissues from spreading irritating chemicals to surrounding tissues. However, if you expose your skin to cold temperatures for too long or without the proper protection, you can damage your skin or nerves. Watch for signs of skin damage due to cold. °HOME CARE INSTRUCTIONS °Follow  these tips to use ice and cold packs safely. °· Place a dry or damp towel between the ice and skin. A damp towel will cool the skin more quickly, so you may need to shorten the time that the ice is used. °· For a more rapid response, add gentle compression to the ice. °· Ice for no more than 10 to 20 minutes at a time. The bonier the area you are icing, the less time it will take to get the benefits of ice. °· Check your skin after 5 minutes to make sure there are no signs of a poor response to cold or skin damage. °· Rest 20 minutes or more between uses. °· Once your skin is numb, you can end your treatment. You can test numbness by very lightly touching your skin. The touch should be so light that you do not see the skin dimple from the pressure of your fingertip. When using ice, most people will feel these normal sensations in this order: cold, burning, aching, and numbness. °· Do not use ice on someone who cannot communicate their responses to pain, such as small children or people with dementia. °HOW TO MAKE AN ICE PACK °Ice packs are the most common way to use ice therapy. Other methods include ice massage, ice baths, and cryosprays. Muscle creams that cause a cold, tingly feeling do not offer the same benefits that ice offers and should not be used as a substitute unless recommended by your caregiver. °To make an ice pack, do one of the following: °· Place crushed ice or a   bag of frozen vegetables in a sealable plastic bag. Squeeze out the excess air. Place this bag inside another plastic bag. Slide the bag into a pillowcase or place a damp towel between your skin and the bag. °· Mix 3 parts water with 1 part rubbing alcohol. Freeze the mixture in a sealable plastic bag. When you remove the mixture from the freezer, it will be slushy. Squeeze out the excess air. Place this bag inside another plastic bag. Slide the bag into a pillowcase or place a damp towel between your skin and the bag. °SEEK MEDICAL CARE  IF: °· You develop white spots on your skin. This may give the skin a blotchy (mottled) appearance. °· Your skin turns blue or pale. °· Your skin becomes waxy or hard. °· Your swelling gets worse. °MAKE SURE YOU:  °· Understand these instructions. °· Will watch your condition. °· Will get help right away if you are not doing well or get worse. °Document Released: 09/17/2010 Document Revised: 06/07/2013 Document Reviewed: 09/17/2010 °[employer]® Patient Information ©2015 [employer]. This information is not intended to replace advice given to you by your health care provider. Make sure you discuss any questions you have with your health care provider. ° °Contusion °A contusion is a deep bruise. Contusions happen when an injury causes bleeding under the skin. Signs of bruising include pain, puffiness (swelling), and discolored skin. The contusion may turn blue, purple, or yellow. °HOME CARE  °· Put ice on the injured area. °¨ Put ice in a plastic bag. °¨ Place a towel between your skin and the bag. °¨ Leave the ice on for 15-20 minutes, 03-04 times a day. °· Only take medicine as told by your doctor. °· Rest the injured area. °· If possible, raise (elevate) the injured area to lessen puffiness. °GET HELP RIGHT AWAY IF:  °· You have more bruising or puffiness. °· You have pain that is getting worse. °· Your puffiness or pain is not helped by medicine. °MAKE SURE YOU:  °· Understand these instructions. °· Will watch your condition. °· Will get help right away if you are not doing well or get worse. °Document Released: 07/10/2007 Document Revised: 04/15/2011 Document Reviewed: 11/26/2010 °[employer]® Patient Information ©2015 [employer]. This information is not intended to replace advice given to you by your health care provider. Make sure you discuss any questions you have with your health care provider. ° °

## 2015-01-26 ENCOUNTER — Emergency Department (HOSPITAL_COMMUNITY): Payer: Self-pay

## 2015-01-26 ENCOUNTER — Encounter (HOSPITAL_COMMUNITY): Payer: Self-pay | Admitting: Emergency Medicine

## 2015-01-26 ENCOUNTER — Emergency Department (HOSPITAL_COMMUNITY)
Admission: EM | Admit: 2015-01-26 | Discharge: 2015-01-26 | Disposition: A | Discharge status: Home / Self Care | Payer: Self-pay | Attending: Emergency Medicine | Admitting: Emergency Medicine

## 2015-01-26 DIAGNOSIS — Y9289 Other specified places as the place of occurrence of the external cause: Secondary | ICD-10-CM | POA: Insufficient documentation

## 2015-01-26 DIAGNOSIS — Y990 Civilian activity done for income or pay: Secondary | ICD-10-CM | POA: Insufficient documentation

## 2015-01-26 DIAGNOSIS — Y9389 Activity, other specified: Secondary | ICD-10-CM | POA: Insufficient documentation

## 2015-01-26 DIAGNOSIS — K047 Periapical abscess without sinus: Secondary | ICD-10-CM | POA: Insufficient documentation

## 2015-01-26 DIAGNOSIS — S060X0A Concussion without loss of consciousness, initial encounter: Secondary | ICD-10-CM | POA: Insufficient documentation

## 2015-01-26 DIAGNOSIS — Z791 Long term (current) use of non-steroidal anti-inflammatories (NSAID): Secondary | ICD-10-CM | POA: Insufficient documentation

## 2015-01-26 DIAGNOSIS — F17200 Nicotine dependence, unspecified, uncomplicated: Secondary | ICD-10-CM | POA: Insufficient documentation

## 2015-01-26 DIAGNOSIS — W228XXA Striking against or struck by other objects, initial encounter: Secondary | ICD-10-CM | POA: Insufficient documentation

## 2015-01-26 MED ORDER — HYDROCODONE-ACETAMINOPHEN 5-325 MG PO TABS: ORAL_TABLET | ORAL | Status: DC

## 2015-01-26 MED ORDER — PROCHLORPERAZINE MALEATE 10 MG PO TABS
10.0000 mg | ORAL_TABLET | Freq: Once | ORAL | Status: AC
  Administered 2015-01-26: 10 mg via ORAL
  Filled 2015-01-26: qty 1

## 2015-01-26 MED ORDER — HYDROCODONE-ACETAMINOPHEN 5-325 MG PO TABS
1.0000 | ORAL_TABLET | Freq: Once | ORAL | Status: AC
Start: 2015-01-26 — End: 2015-01-26
  Administered 2015-01-26: 1 via ORAL
  Filled 2015-01-26: qty 1

## 2015-01-26 MED ORDER — MORPHINE SULFATE (PF) 4 MG/ML IV SOLN
4.0000 mg | Freq: Once | INTRAVENOUS | Status: DC
  Filled 2015-01-26: qty 1

## 2015-01-26 MED ORDER — AMOXICILLIN 500 MG PO CAPS
500.0000 mg | ORAL_CAPSULE | Freq: Once | ORAL | Status: AC
  Administered 2015-01-26: 500 mg via ORAL
  Filled 2015-01-26: qty 1

## 2015-01-26 MED ORDER — PREDNISONE 20 MG PO TABS
60.0000 mg | ORAL_TABLET | Freq: Once | ORAL | Status: AC
  Administered 2015-01-26: 60 mg via ORAL
  Filled 2015-01-26: qty 3

## 2015-01-26 MED ORDER — PROCHLORPERAZINE EDISYLATE 5 MG/ML IJ SOLN: 10.0000 mg | Freq: Once | INTRAMUSCULAR | Status: DC

## 2015-01-26 MED ORDER — AMOXICILLIN 500 MG PO CAPS: 500.0000 mg | ORAL_CAPSULE | Freq: Three times a day (TID) | ORAL | Status: DC

## 2015-01-26 MED ORDER — DEXAMETHASONE SODIUM PHOSPHATE 10 MG/ML IJ SOLN
10.0000 mg | Freq: Once | INTRAMUSCULAR | Status: DC
Start: 2015-01-26 — End: 2015-01-26
  Filled 2015-01-26: qty 1

## 2015-01-26 NOTE — ED Notes (Signed)
Patient complains of right sided head pain starting yesterday after rising to a standing position at work and hitting his head on a metal pole. Patient states the pain is worse than anything and different from anything he's felt before. Patient is tearful and is diaphoretic but alert and oriented at this time.

## 2015-01-26 NOTE — Discharge Instructions (Signed)
Do not participate in any sports or any activities that could result in head trauma until you are cleared by your pediatrician,  primary care physician or neurologist.   Take vicodin for breakthrough pain, do not drink alcohol, drive, care for children or do other critical tasks while taking vicodin.  Do not hesitate to return to the emergency room for any new, worsening or concerning symptoms.  Please obtain primary care using resource guide below. Let them know that you were seen in the emergency room and that they will need to obtain records for further outpatient management.    Concussion, Adult A concussion is a brain injury. It is caused by:  A hit to the head.  A quick and sudden movement (jolt) of the head or neck. A concussion is usually not life threatening. Even so, it can cause serious problems. If you had a concussion before, you may have concussion-like problems after a hit to your head. HOME CARE General Instructions  Follow your doctor's directions carefully.  Take medicines only as told by your doctor.  Only take medicines your doctor says are safe.  Do not drink alcohol until your doctor says it is okay. Alcohol and some drugs can slow down healing. They can also put you at risk for further injury.  If you are having trouble remembering things, write them down.  Try to do one thing at a time if you get distracted easily. For example, do not watch TV while making dinner.  Talk to your family members or close friends when making important decisions.  Follow up with your doctor as told.  Watch your symptoms. Tell others to do the same. Serious problems can sometimes happen after a concussion. Older adults are more likely to have these problems.  Tell your teachers, school nurse, school counselor, coach, Event organiserathletic trainer, or work Production designer, theatre/television/filmmanager about your concussion. Tell them about what you can or cannot do. They should watch to see if:  It gets even harder for you to  pay attention or concentrate.  It gets even harder for you to remember things or learn new things.  You need more time than normal to finish things.  You become annoyed (irritable) more than before.  You are not able to deal with stress as well.  You have more problems than before.  Rest. Make sure you:  Get plenty of sleep at night.  Go to sleep early.  Go to bed at the same time every day. Try to wake up at the same time.  Rest during the day.  Take naps when you feel tired.  Limit activities where you have to think a lot or concentrate. These include:  Doing homework.  Doing work related to a job.  Watching TV.  Using the computer. Returning To Your Regular Activities Return to your normal activities slowly, not all at once. You must give your body and brain enough time to heal.   Do not play sports or do other athletic activities until your doctor says it is okay.  Ask your doctor when you can drive, ride a bicycle, or work other vehicles or machines. Never do these things if you feel dizzy.  Ask your doctor about when you can return to work or school. Preventing Another Concussion It is very important to avoid another brain injury, especially before you have healed. In rare cases, another injury can lead to permanent brain damage, brain swelling, or death. The risk of this is greatest during the first  7-10 days after your injury. Avoid injuries by:   Wearing a seat belt when riding in a car.  Not drinking too much alcohol.  Avoiding activities that could lead to a second concussion (such as contact sports).  Wearing a helmet when doing activities like:  Biking.  Skiing.  Skateboarding.  Skating.  Making your home safer by:  Removing things from the floor or stairways that could make you trip.  Using grab bars in bathrooms and handrails by stairs.  Placing non-slip mats on floors and in bathtubs.  Improve lighting in dark areas. GET HELP  IF:  It gets even harder for you to pay attention or concentrate.  It gets even harder for you to remember things or learn new things.  You need more time than normal to finish things.  You become annoyed (irritable) more than before.  You are not able to deal with stress as well.  You have more problems than before.  You have problems keeping your balance.  You are not able to react quickly when you should. Get help if you have any of these problems for more than 2 weeks:   Lasting (chronic) headaches.  Dizziness or trouble balancing.  Feeling sick to your stomach (nausea).  Seeing (vision) problems.  Being affected by noises or light more than normal.  Feeling sad, low, down in the dumps, blue, gloomy, or empty (depressed).  Mood changes (mood swings).  Feeling of fear or nervousness about what may happen (anxiety).  Feeling annoyed.  Memory problems.  Problems concentrating or paying attention.  Sleep problems.  Feeling tired all the time. GET HELP RIGHT AWAY IF:   You have bad headaches or your headaches get worse.  You have weakness (even if it is in one hand, leg, or part of the face).  You have loss of feeling (numbness).  You feel off balance.  You keep throwing up (vomiting).  You feel tired.  One black center of your eye (pupil) is larger than the other.  You twitch or shake violently (convulse).  Your speech is not clear (slurred).  You are more confused, easily angered (agitated), or annoyed than before.  You have more trouble resting than before.  You are unable to recognize people or places.  You have neck pain.  It is difficult to wake you up.  You have unusual behavior changes.  You pass out (lose consciousness). MAKE SURE YOU:   Understand these instructions.  Will watch your condition.  Will get help right away if you are not doing well or get worse.   This information is not intended to replace advice given to you  by your health care provider. Make sure you discuss any questions you have with your health care provider.   Document Released: 01/09/2009 Document Revised: 02/11/2014 Document Reviewed: 08/13/2012 Elsevier Interactive Patient Education 2016 Elsevier Inc.  Dental Abscess A dental abscess is pus in or around a tooth. HOME CARE  Take medicines only as told by your dentist.  If you were prescribed antibiotic medicine, finish all of it even if you start to feel better.  Rinse your mouth (gargle) often with salt water.  Do not drive or use heavy machinery, like a lawn mower, while taking pain medicine.  Do not apply heat to the outside of your mouth.  Keep all follow-up visits as told by your dentist. This is important. GET HELP IF:  Your pain is worse, and medicine does not help. GET HELP RIGHT AWAY  IF:  You have a fever or chills.  Your symptoms suddenly get worse.  You have a very bad headache.  You have problems breathing or swallowing.  You have trouble opening your mouth.  You have puffiness (swelling) in your neck or around your eye.   This information is not intended to replace advice given to you by your health care provider. Make sure you discuss any questions you have with your health care provider.   Document Released: 06/07/2014 Document Reviewed: 06/07/2014 Elsevier Interactive Patient Education [employer]  Emergency Department Resource Guide 1) Find a Doctor and Pay Out of Pocket Although you won't have to find out who is covered by your insurance plan, it is a good idea to ask around and get recommendations. You will then need to call the office and see if the doctor you have chosen will accept you as a new patient and what types of options they offer for patients who are self-pay. Some doctors offer discounts or will set up payment plans for their patients who do not have insurance, but you will need to ask so you aren't surprised when you get to your  appointment.  2) Contact Your Local Health Department Not all health departments have doctors that can see patients for sick visits, but many do, so it is worth a call to see if yours does. If you don't know where your local health department is, you can check in your phone book. The CDC also has a tool to help you locate your state's health department, and many state websites also have listings of all of their local health departments.  3) Find a Walk-in Clinic If your illness is not likely to be very severe or complicated, you may want to try a walk in clinic. These are popping up all over the country in pharmacies, drugstores, and shopping centers. They're usually staffed by nurse practitioners or physician assistants that have been trained to treat common illnesses and complaints. They're usually fairly quick and inexpensive. However, if you have serious medical issues or chronic medical problems, these are probably not your best option.  No Primary Care Doctor: - Call Health Connect at  [phone] - they can help you locate a primary care doctor that  accepts your insurance, provides certain services, etc. - Physician Referral Service- [phone]  Chronic Pain Problems: Organization         Address  Phone   Notes  [facility]  [phone] Patients need to be referred by their primary care doctor.   Medication Assistance: Organization         Address  Phone   Notes  [facility] Medication [facility] [address] [place] [place], [place] [address] [phone] --Must be a resident of [facility] -- Must have NO insurance coverage whatsoever (no Medicaid/ Medicare, etc.) -- The pt. MUST have a primary care doctor that directs their care regularly and follows them in the community   [facility]  [phone]   [employer]  [phone]    Agencies that provide inexpensive medical care: Organization         Address  Phone   Notes  [facility]  [phone]   [facility]    [phone]   [facility] [address] [place], [place] [address] [phone]   [facility] [address] [place] [phone]  Planned Parenthood    [phone]   [facility]    [phone]   [facility]  [address], [place] Phone:  [phone], Fax:  [phone] Hours of Operation:  9 am - 6 pm, M-F.  Also accepts Medicaid/Medicare and self-pay.  [facility]  [address], [place] Phone: [phone], Fax: [phone]. Hours of Operation:  8:30 am - 5:30 pm, M-F.  Also accepts Medicaid and self-pay.  [facility] [place] [address] [place] [address] [place] Phone: [phone]   [facility] [address] Natasha Bence [place], [place] [phone], Ext. [phone] Mondays & Thursdays: 7-9 AM.  First 15 patients are seen on a first come, first serve basis.    Medicaid-accepting [facility] Providers:  Organization         Address  Phone   Notes  [facility] [address], [place] [phone] Also accepts self-pay patients.  [facility] [address] Laurell Josephs [place], [place]  [phone]   [facility] [address], [place] [phone]   [facility] [address], [place] [phone]   Renaye Rakers [address], [place]   [phone] Only accepts [place] Access [place] patients after they have their name applied to their card.   Self-Pay (no insurance) in [facility]:  Organization         Address  Phone   Notes  Sickle Cell Patients, [facility] [address] [place], [place] [phone]   [facility] [address] [place], [place] [phone]   [facility]  [place]  [address], [place] [phone]   [facility]/Dr. Osei-Bonsu  [address] [place] or [address], [place] [phone] Phone number for both [place] and [place] locations is the same.  [facility] [address] [place] [phone]   [facility] [address] [place] or [address] [phone]   [facility] [address], [place] [phone], phone; [phone], fax Sees patients 1st and 3rd Saturday of every month.  Must not qualify for public or private insurance (i.e. Medicaid, Medicare, [place] Health Choice, Veterans' Benefits)  Household income should be no more than 200% of the poverty level The clinic cannot treat you if you are pregnant or think you are pregnant  Sexually transmitted diseases are not treated at the clinic.    Dental Care: Organization         Address  Phone  Notes  [facility] Department of [facility] [address] [place], [place] [phone] Accepts children up to age 33 who are enrolled in [place] or [place] Health Choice; pregnant women with a Medicaid card; and children who have applied for Medicaid or [place] Health Choice, but were declined, whose parents can pay a reduced fee at time of service.  [facility]  [address], [place] [phone] Accepts children up to age 60 who are enrolled in [place] or [place] Health Choice; pregnant women with a Medicaid card; and children who have applied for Medicaid or [place] Health Choice, but were declined, whose  parents can pay a reduced fee at time of service.  [facility]  [address] [place], [place] [phone] Patients are seen by appointment only. Walk-ins are not accepted. [facility] will see patients 24 years of age and  older. Monday - Tuesday (8am-5pm) Most Wednesdays (8:30-5pm) $30 per visit, cash only  [facility] Adult Dental Access PROGRAM  [address], [facility] [phone] Patients are seen by appointment only. Walk-ins are not accepted. [facility] will see patients 13 years of age and older. One Wednesday Evening (Monthly: Volunteer Based).  $30 per visit, cash only  [employer]  [phone] for adults; Children under age 82, call Graduate Pediatric Dentistry at [phone]. Children aged 70-14, please call [phone] to request a pediatric application.  Dental services are provided in all areas of dental care including fillings, crowns and bridges, complete and partial dentures, implants, gum treatment, root canals, and extractions. Preventive care is also provided. Treatment is provided to both adults and children. Patients are selected via a lottery and there is often a waiting list.   [facility] [address], [place]  [phone] [email or web address]   [facility] [address] [place], [place] [phone], Ext. [phone] Second and Fourth Thursday of each month, opens at 6:30 AM; Clinic ends at 9 AM.  Patients are seen on a first-come first-served basis, and a limited number are seen during each clinic.   [facility]  [address] Ether Griffins [place], [place] [phone]   Eligibility Requirements You must have lived in [place], [place], or [place] counties for at least the last three months.   You cannot be eligible for state or federal sponsored [place], including [employer], [place], or [employer].   You generally cannot be eligible for healthcare insurance through your employer.    How to apply: Eligibility screenings are held every Tuesday and Wednesday afternoon from 1:00 pm until 4:00 pm. You do not need an appointment for the interview!  [facility] [address],  [place], [place] [phone]   [facility] Health Department  [phone]   [facility]  [phone]   [facility]  [phone]    Behavioral Health Resources in the Community: Intensive Outpatient Programs Organization         Address  Phone  Notes  [facility] [address] [place], [place] [phone]   [facility] Outpatient [address] [place], [place] [phone]   [facility] [address] [place], [place]  [phone]   [facility] Mental Health [address]  [place], [place] [phone] or [phone]   Substance Abuse Resources Organization         Address  Phone  Notes  [facility]  [phone]   [facility]  [phone]   [facility] [place]  [phone]   Floydene Flock  [phone]   Residential & Outpatient Substance Abuse Program  [phone]   Psychological Services Organization         Address  Phone  Notes  [facility]  [phone]   [facility]  [phone]   [facility] [address], [place] [phone] or [phone]    Mobile Crisis Teams Organization         Address  Phone  Notes  [facility], Mobile Crisis  Care Unit  [phone]   [facility]  [address] [place], [place] [phone]   [facility] [address] [place] [phone]    Self-Help/Support Groups Organization         Address  Phone             Notes  [facility] [place] - variety of support groups  336- I7437963 Call for more information  Narcotics Anonymous (NA), [facility] [address], [employer] [place]  2 meetings at this location   Statistician         Address  Phone  Notes  [facility] [address],    [place]  [phone]   [facility]  [address] [place] [address], [place], [place] [phone]   [facility] [place] [facility] [address] [place], [place] [phone] Admissions: 8am-3pm M-F  [facility] [address]    [place], [place] [phone]   [facility] [address] [place], [place], [place] [phone]   [facility] [address],  [place], [place] [phone]   [facility] - Intensive Outpatient [address] Laurell Josephs [address], [place], [place] [phone]   [facility] ([facility] [address] [place]  [place], [place] [phone] or [phone]   [facility] ([facility]) [address], [place], [place] [phone] Accepts Medicaid  [facility] [address]  [place] [phone] Substance Abuse/Addiction Treatment   [facility] Organization         Address  Phone  Notes  [facility]  [phone]   Angie Fava, PhD [address] Ervin Knack [place], [place]   [phone] or [phone]   [facility]   [address] [place], [place] [phone]   [facility] [address], [place], [place] [phone] Insurance/Medicaid/sponsorship through [facility] and Families [address]                                    [place], [place] [phone] Therapy/tele-psych/case  [facility] [address], [place] [phone]    Dr. Lolly Mustache  [phone]   [facility] [place] [facility]  [employer] [facility] Dept. 1) [address],  2) [address] [place] 3)  [address], [place] [phone]  [phone]   [facility] Child Abuse Hotline 910-618-5701 or [phone] (After Hours)

## 2015-01-26 NOTE — ED Notes (Signed)
Patient transported to CT 

## 2015-01-26 NOTE — ED Provider Notes (Signed)
CSN: [id number]     Arrival date & time 01/26/15  1046 History   First MD Initiated Contact with Patient 01/26/15 1059     Chief Complaint  Patient presents with  . Head Injury     (Consider location/radiation/quality/duration/timing/severity/associated sxs/prior Treatment) HPI   Blood pressure 150/83, pulse 66, temperature 98.2 F (36.8 C), temperature source Oral, resp. rate 16, SpO2 100 %.  Tim Ford is a 29 y.o. male complaining of severe right temporal headache significantly worsening over the course of the last 20 hours. Patient stood up and he hit his head against a metal pole yesterday, initially he felt chilled but it wasn't that severe, the pain worsened throughout the night, it radiates down to the jaw. He taken ibuprofen and Tylenol at home with no relief, he got no sleep, pain is rated at 10 out of 10, no exacerbating or alleviating factors identified. No history of headache. Patient denies change in vision, nausea, vomiting, dysarthria, ataxia, anticoagulation, syncope.    History reviewed. No pertinent past medical history. History reviewed. No pertinent past surgical history. Family History  Problem Relation Age of Onset  . Diabetes Other    Social History  Substance Use Topics  . Smoking status: Current Some Day Smoker  . Smokeless tobacco: None  . Alcohol Use: Yes     Comment: occ    Review of Systems  10 systems reviewed and found to be negative, except as noted in the HPI.   Allergies  Coconut oil; Peanuts; and Sesame seed  Home Medications   Prior to Admission medications   Medication Sig Start Date End Date Taking? Authorizing Provider  acetaminophen (TYLENOL) 500 MG tablet Take 1,000 mg by mouth every 6 (six) hours as needed.   Yes Historical Provider, MD  ibuprofen (ADVIL,MOTRIN) 800 MG tablet Take 1 tablet (800 mg total) by mouth 3 (three) times daily. 05/08/14  Yes Elpidio Anis, PA-C  HYDROcodone-acetaminophen (NORCO/VICODIN) 5-325 MG per  tablet Take 1-2 tablets by mouth every 6 (six) hours as needed for moderate pain or severe pain. 12/02/13   Courtney Forcucci, PA-C  naproxen (NAPROSYN) 500 MG tablet Take 1 tablet (500 mg total) by mouth 2 (two) times daily. 03/08/14   Gwyneth Sprout, MD   BP 150/83 mmHg  Pulse 66  Temp(Src) 98.2 F (36.8 C) (Oral)  Resp 16  SpO2 100% Physical Exam  Constitutional: He is oriented to person, place, and time. He appears well-developed and well-nourished. No distress.  Crying, holding his right zygoma/temple, rocking back and forth  HENT:  Head: Normocephalic and atraumatic.  Mouth/Throat: Oropharynx is clear and moist.  Eyes: Conjunctivae and EOM are normal. Pupils are equal, round, and reactive to light.  Bilateral conjunctiva injected likely secondary to crying  Neck: Normal range of motion. Neck supple.  FROM to C-spine. Pt can touch chin to chest without discomfort. No TTP of midline cervical spine.   Cardiovascular: Normal rate, regular rhythm and intact distal pulses.   Pulmonary/Chest: Effort normal and breath sounds normal. No stridor. No respiratory distress. He has no wheezes. He has no rales. He exhibits no tenderness.  Abdominal: Soft. Bowel sounds are normal. There is no tenderness.  Musculoskeletal: Normal range of motion. He exhibits no edema or tenderness.  Neurological: He is alert and oriented to person, place, and time. No cranial nerve deficit.  II-Visual fields grossly intact. III/IV/VI-Extraocular movements intact.  Pupils reactive bilaterally. V/VII-Smile symmetric, equal eyebrow raise,  facial sensation intact VIII- Hearing grossly intact IX/X-Normal  gag XI-bilateral shoulder shrug XII-midline tongue extension Motor: 5/5 bilaterally with normal tone and bulk Cerebellar: Normal finger-to-nose  and normal heel-to-shin test.   Ambulates with a coordinated gait   Psychiatric: He has a normal mood and affect.  Nursing note and vitals reviewed.   ED Course   Procedures (including critical care time) Labs Review Labs Reviewed - No data to display  Imaging Review No results found. I have personally reviewed and evaluated these images and lab results as part of my medical decision-making.   EKG Interpretation None      MDM   Final diagnoses:  Concussion, without loss of consciousness, initial encounter  Dental abscess    Filed Vitals:   01/26/15 1052  BP: 150/83  Pulse: 66  Temp: 98.2 F (36.8 C)  TempSrc: Oral  Resp: 16  SpO2: 100%    Medications  amoxicillin (AMOXIL) capsule 500 mg (not administered)  prochlorperazine (COMPAZINE) tablet 10 mg (10 mg Oral Given 01/26/15 1254)  HYDROcodone-acetaminophen (NORCO/VICODIN) 5-325 MG per tablet 1 tablet (1 tablet Oral Given 01/26/15 1254)  predniSONE (DELTASONE) tablet 60 mg (60 mg Oral Given 01/26/15 1254)    Tim Ford is 29 y.o. male presenting with indolent onset of severe right temporal right maxillofacial pain. Patient had minor trauma yesterday, hit his head on a metal pipe while standing up, there was no loss of consciousness. Neuro exam is nonfocal, no meningeal signs. Doubt subarachnoid, discussed with attending who agrees this patient does not need tap. CT brain/maxillofacial is negative for acute findings however, they do note periapical abscesses. Patient will be treated for dental abscess and given dental referral. Patient states he does not need a work note. Patient's pain is improved in the ED.  Evaluation does not show pathology that would require ongoing emergent intervention or inpatient treatment. Pt is hemodynamically stable and mentating appropriately. Discussed findings and plan with patient/guardian, who agrees with care plan. All questions answered. Return precautions discussed and outpatient follow up given.   New Prescriptions   AMOXICILLIN (AMOXIL) 500 MG CAPSULE    Take 1 capsule (500 mg total) by mouth 3 (three) times daily.    HYDROCODONE-ACETAMINOPHEN (NORCO/VICODIN) 5-325 MG TABLET    Take 1-2 tablets by mouth every 6 hours as needed for pain and/or cough.         Wynetta Emeryicole Iseah Plouff, PA-C 01/26/15 1434  Vanetta MuldersScott Zackowski, MD 01/28/15 [phone]

## 2015-08-21 ENCOUNTER — Emergency Department (HOSPITAL_COMMUNITY)
Admission: EM | Admit: 2015-08-21 | Discharge: 2015-08-21 | Disposition: A | Discharge status: Home / Self Care | Payer: No Typology Code available for payment source | Attending: Emergency Medicine | Admitting: Emergency Medicine

## 2015-08-21 ENCOUNTER — Encounter (HOSPITAL_COMMUNITY): Payer: Self-pay

## 2015-08-21 DIAGNOSIS — K029 Dental caries, unspecified: Secondary | ICD-10-CM | POA: Insufficient documentation

## 2015-08-21 DIAGNOSIS — K0889 Other specified disorders of teeth and supporting structures: Secondary | ICD-10-CM | POA: Insufficient documentation

## 2015-08-21 DIAGNOSIS — Z791 Long term (current) use of non-steroidal anti-inflammatories (NSAID): Secondary | ICD-10-CM | POA: Insufficient documentation

## 2015-08-21 DIAGNOSIS — F17200 Nicotine dependence, unspecified, uncomplicated: Secondary | ICD-10-CM | POA: Insufficient documentation

## 2015-08-21 DIAGNOSIS — R51 Headache: Secondary | ICD-10-CM | POA: Insufficient documentation

## 2015-08-21 DIAGNOSIS — H9202 Otalgia, left ear: Secondary | ICD-10-CM | POA: Insufficient documentation

## 2015-08-21 MED ORDER — AMOXICILLIN-POT CLAVULANATE 875-125 MG PO TABS: 1.0000 | ORAL_TABLET | Freq: Two times a day (BID) | ORAL | Status: DC

## 2015-08-21 MED ORDER — TRAMADOL HCL 50 MG PO TABS
50.0000 mg | ORAL_TABLET | Freq: Once | ORAL | Status: AC
  Administered 2015-08-21: 50 mg via ORAL
  Filled 2015-08-21: qty 1

## 2015-08-21 MED ORDER — LIDOCAINE VISCOUS 2 % MT SOLN
15.0000 mL | Freq: Once | OROMUCOSAL | Status: AC
  Administered 2015-08-21: 15 mL via OROMUCOSAL
  Filled 2015-08-21 (×2): qty 15

## 2015-08-21 MED ORDER — LIDOCAINE VISCOUS 2 % MT SOLN: 15.0000 mL | Freq: Four times a day (QID) | OROMUCOSAL | Status: DC | PRN

## 2015-08-21 NOTE — Discharge Instructions (Signed)
Read the information below.   You are being prescribed an antibiotic. Take as prescribed. If you develop a reaction stop immediately and come to the ED. You are also being prescribed viscous lidocaine. Swish and spit every 6hrs as needed for pain relief. Continue to take tylenol at home for pain relief. You can also perform warm salt water gargles.  It is important that you follow up with a dentist. I have provided the contact information for a dentist above. I have also provided resources below for dentist. Please call to be seen.  Use the prescribed medication as directed.  Please discuss all new medications with your pharmacist.   You may return to the Emergency Department at any time for worsening condition or any new symptoms that concern you. Return to ED if your symptoms worsen or you develop a fever, inability to swallow, difficulty breathing, or oral/facial swelling, numbness/weakness, or changes in vision.   [employer] [facility] [employer] is a great source of information about community services available.  Access by dialing 2-1-1 from anywhere in [place], or by website -  PooledIncome.plwww.nc211.org.   Other Local Resources (Updated 02/2015)  Dental  Care   Services    Phone Number and Address  Cost  [facility] For children 640 - 28 years of age:   Cleaning  Tooth brushing/flossing instruction  Sealants, fillings, crowns  Extractions  Emergency treatment  [phone] [address] [place], [place] [address] Charges based on family income.  Medicaid and some insurance plans accepted.     [facility]  Cleaning  Sealants, fillings, crowns  Extractions  Emergency treatment [phone] [address] [place], [place]  Pregnant women 28 years of age or older with a Medicaid card  [facility] [place]  Cleaning  Sealants, fillings,  crowns  Extractions  Emergency treatment [phone] [address] [place], [place] Pregnant women 28 years of age or older with a Medicaid card  [facility] For children 260 - 28 years of age:   Cleaning  Tooth brushing/flossing instruction  Sealants, fillings, crowns  Extractions  Emergency treatment Limited orthodontic services for patients with Medicaid [phone] [address] [place], [place] [address] Medicaid and [facility] Health Choice cover for children up to age 28 and pregnant women.  Parents of children up to age 28 without Medicaid pay a reduced fee at time of service.  [facility] [place] For children 450 - 721 years of age:   Cleaning  Tooth brushing/flossing instruction  Sealants, fillings, crowns  Extractions  Emergency treatment Limited orthodontic services for patients with Medicaid [phone] [address] [place], [place].  Medicaid and [place] Health Choice cover for children up to age 28 and pregnant women.  Parents of children up to age 421 without Medicaid pay a reduced fee.  [facility]  Cleaning  Sealants, fillings, crowns  Extractions  Hours: Tuesdays and Thursdays, 4:15 - 8 pm [phone] [address] [place], [place] [address] Services free of charge to [facility] residents ages 18-64 who do not have health insurance, Medicare, [place], or [place] benefits and fall within federal poverty guidelines  [facility]    Provides dental care in addition to primary medical care, nutritional counseling, and pharmacy:  Cleaning  Sealants, fillings,  crowns  Extractions                  [phone] [facility], [address] [place], [place]  [phone] [facility], [address] [place],  [place]  [phone] [facility] [place], [place]  [phone] [facility], [address] [place], [place]  [phone] [facility] [address] [place], [place] Accepts [place], [place], most insurance.  Also provides services available to all with fees adjusted based on ability to pay.    [facility]  Cleaning  Tooth brushing/flossing instruction  Sealants, fillings, crowns  Extractions  Emergency treatment Hours: Tuesdays, Thursdays, and Fridays from 8 am to 5 pm by appointment only. [phone] [address] [place], [place] [address] [facility] residents with Medicaid (depending on eligibility) and children with [facility] Health Choice - call for more information.  [facility]  Extractions only  Hours: 2nd and 4th Thursday of each month from 6:30 am - 9 am.   [phone] [place], [place] [address] Ages 39 and older only.  Patients are seen on a first come, first served basis.  [employer]  [employer]  Extractions  Orthodontics  Endodontics  Implants/Crowns/Bridges  Complete and partial dentures [phone] [place], [place] Patients must complete an application for services.  There is often a waiting list.

## 2015-08-21 NOTE — ED Notes (Signed)
Pt with wisdom teeth pain radiating behind ear. Headaches  X 2 weeks.  Has not been able to see dentist d/t insurance not kicking in for 3 more weeks.
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
## 2015-12-17 ENCOUNTER — Emergency Department (HOSPITAL_COMMUNITY)
Admission: EM | Admit: 2015-12-17 | Discharge: 2015-12-17 | Disposition: A | Discharge status: Home / Self Care | Payer: No Typology Code available for payment source | Attending: Emergency Medicine | Admitting: Emergency Medicine

## 2015-12-17 ENCOUNTER — Encounter (HOSPITAL_COMMUNITY): Payer: Self-pay | Admitting: Emergency Medicine

## 2015-12-17 ENCOUNTER — Emergency Department (HOSPITAL_COMMUNITY): Payer: No Typology Code available for payment source

## 2015-12-17 DIAGNOSIS — Y939 Activity, unspecified: Secondary | ICD-10-CM | POA: Insufficient documentation

## 2015-12-17 DIAGNOSIS — S92414A Nondisplaced fracture of proximal phalanx of right great toe, initial encounter for closed fracture: Secondary | ICD-10-CM | POA: Diagnosis not present

## 2015-12-17 DIAGNOSIS — S99921A Unspecified injury of right foot, initial encounter: Secondary | ICD-10-CM | POA: Diagnosis present

## 2015-12-17 DIAGNOSIS — F17200 Nicotine dependence, unspecified, uncomplicated: Secondary | ICD-10-CM | POA: Diagnosis not present

## 2015-12-17 DIAGNOSIS — Y999 Unspecified external cause status: Secondary | ICD-10-CM | POA: Insufficient documentation

## 2015-12-17 DIAGNOSIS — Y92410 Unspecified street and highway as the place of occurrence of the external cause: Secondary | ICD-10-CM | POA: Insufficient documentation

## 2015-12-17 DIAGNOSIS — Z91010 Allergy to peanuts: Secondary | ICD-10-CM | POA: Insufficient documentation

## 2015-12-17 MED ORDER — HYDROCODONE-ACETAMINOPHEN 5-325 MG PO TABS: 1.0000 | ORAL_TABLET | ORAL | 0 refills | Status: DC | PRN

## 2015-12-17 NOTE — ED Notes (Signed)
Ortho tech notified of new orders 
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
## 2015-12-17 NOTE — ED Notes (Signed)
Ortho notified of new orders  

## 2015-12-17 NOTE — Discharge Instructions (Signed)
Take the prescribed medication as directed. °Follow-up with orthopedics-- call to make appt. °Return to the ED for new or worsening symptoms. °
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
## 2016-09-01 ENCOUNTER — Emergency Department (HOSPITAL_COMMUNITY): Payer: Self-pay

## 2016-09-01 ENCOUNTER — Emergency Department (HOSPITAL_COMMUNITY)
Admission: EM | Admit: 2016-09-01 | Discharge: 2016-09-01 | Disposition: A | Discharge status: Home / Self Care | Payer: Self-pay | Attending: Emergency Medicine | Admitting: Emergency Medicine

## 2016-09-01 ENCOUNTER — Encounter (HOSPITAL_COMMUNITY): Payer: Self-pay

## 2016-09-01 DIAGNOSIS — Z79899 Other long term (current) drug therapy: Secondary | ICD-10-CM | POA: Insufficient documentation

## 2016-09-01 DIAGNOSIS — F17210 Nicotine dependence, cigarettes, uncomplicated: Secondary | ICD-10-CM | POA: Insufficient documentation

## 2016-09-01 DIAGNOSIS — H9202 Otalgia, left ear: Secondary | ICD-10-CM | POA: Insufficient documentation

## 2016-09-01 DIAGNOSIS — R519 Headache, unspecified: Secondary | ICD-10-CM

## 2016-09-01 DIAGNOSIS — K029 Dental caries, unspecified: Secondary | ICD-10-CM | POA: Insufficient documentation

## 2016-09-01 DIAGNOSIS — R51 Headache: Secondary | ICD-10-CM | POA: Insufficient documentation

## 2016-09-01 LAB — CBC WITH DIFFERENTIAL/PLATELET
BASOS ABS: 0 10*3/uL (ref 0.0–0.1)
Basophils Relative: 0 %
EOS ABS: 0.1 10*3/uL (ref 0.0–0.7)
EOS PCT: 2 %
HCT: 41.3 % (ref 39.0–52.0)
Hemoglobin: 13.8 g/dL (ref 13.0–17.0)
Lymphocytes Relative: 39 %
Lymphs Abs: 2.5 10*3/uL (ref 0.7–4.0)
MCH: 28.5 pg (ref 26.0–34.0)
MCHC: 33.4 g/dL (ref 30.0–36.0)
MCV: 85.2 fL (ref 78.0–100.0)
Monocytes Absolute: 0.4 10*3/uL (ref 0.1–1.0)
Monocytes Relative: 7 %
NEUTROS ABS: 3.3 10*3/uL (ref 1.7–7.7)
Neutrophils Relative %: 52 %
PLATELETS: 213 10*3/uL (ref 150–400)
RBC: 4.85 MIL/uL (ref 4.22–5.81)
RDW: 13.4 % (ref 11.5–15.5)
WBC: 6.3 10*3/uL (ref 4.0–10.5)

## 2016-09-01 LAB — BASIC METABOLIC PANEL
ANION GAP: 8 (ref 5–15)
BUN: 12 mg/dL (ref 6–20)
CALCIUM: 9.2 mg/dL (ref 8.9–10.3)
CO2: 27 mmol/L (ref 22–32)
Chloride: 104 mmol/L (ref 101–111)
Creatinine, Ser: 1.15 mg/dL (ref 0.61–1.24)
GFR calc Af Amer: >60 mL/min (ref >60)
GLUCOSE: 96 mg/dL (ref 65–99)
Potassium: 3.9 mmol/L (ref 3.5–5.1)
Sodium: 139 mmol/L (ref 135–145)

## 2016-09-01 MED ORDER — DIPHENHYDRAMINE HCL 50 MG/ML IJ SOLN
25.0000 mg | Freq: Once | INTRAMUSCULAR | Status: AC
  Administered 2016-09-01: 25 mg via INTRAVENOUS
  Filled 2016-09-01: qty 1

## 2016-09-01 MED ORDER — METOCLOPRAMIDE HCL 10 MG PO TABS: 10.0000 mg | ORAL_TABLET | Freq: Four times a day (QID) | ORAL | 0 refills | Status: DC | PRN

## 2016-09-01 MED ORDER — METOCLOPRAMIDE HCL 5 MG/ML IJ SOLN
10.0000 mg | Freq: Once | INTRAMUSCULAR | Status: AC
  Administered 2016-09-01: 10 mg via INTRAVENOUS
  Filled 2016-09-01: qty 2

## 2016-09-01 MED ORDER — AMOXICILLIN 500 MG PO CAPS: 1000.0000 mg | ORAL_CAPSULE | Freq: Two times a day (BID) | ORAL | 0 refills | Status: DC

## 2016-09-01 MED ORDER — SODIUM CHLORIDE 0.9 % IV BOLUS (SEPSIS)
1000.0000 mL | Freq: Once | INTRAVENOUS | Status: AC
  Administered 2016-09-01: 1000 mL via INTRAVENOUS

## 2016-09-01 MED ORDER — AMOXICILLIN 500 MG PO CAPS
1000.0000 mg | ORAL_CAPSULE | Freq: Once | ORAL | Status: AC
  Administered 2016-09-01: 1000 mg via ORAL
  Filled 2016-09-01: qty 2

## 2016-09-01 NOTE — ED Triage Notes (Signed)
Since 0900 am yesterday left sided headache and now about 2 hours ago left eye blurred vision alert and oriented x 3 clear speech noted moves all extremities.

## 2016-09-01 NOTE — ED Provider Notes (Signed)
[facility]-EMERGENCY DEPT Provider Note   CSN: [id number] Arrival date & time: 09/01/16  0012   By signing my name below, I, Clarisse GougeXavier Herndon, attest that this documentation has been prepared under the direction and in the presence of Dione BoozeGlick, Aragon Scarantino, MD. Electronically signed, Clarisse GougeXavier Herndon, ED Scribe. 09/01/16. 1:43 AM.   History   Chief Complaint Chief Complaint  Patient presents with  . Headache   The history is provided by the patient and medical records. No language interpreter was used.    Tim Ford is a 31 y.o. male presenting to the Emergency Department concerning ear pain onset ~3-4 hours prior to evaluation. Pt states this has been a recurrent issue x "a while". Associated headache behind the L eye, upper L dental pain and throat pain. Mother reports facial swelling; pt also notes blurred vision and vision changes PTA that have subsided on evaluation. He describes 7.5/10 improved from 9/10 severity headache [facility] that is constant and throbbing. Pt states this is worse with direct light exposure to the L eye. He noted 8/10 pain to all other affected areas. No PCP noted for F/U. No nausea or any other complaints noted at this time.   History reviewed. No pertinent past medical history.  There are no active problems to display for this patient.   History reviewed. No pertinent surgical history.     Home Medications    Prior to Admission medications   Medication Sig Start Date End Date Taking? Authorizing Provider  acetaminophen (TYLENOL) 500 MG tablet Take 1,000 mg by mouth every 6 (six) hours as needed.    [provider]  amoxicillin-clavulanate (AUGMENTIN) 875-125 MG tablet Take 1 tablet by mouth every 12 (twelve) hours. 08/21/15   Deborha PaymentMeyer, Ashley L, PA-C  HYDROcodone-acetaminophen (NORCO/VICODIN) 5-325 MG tablet Take 1 tablet by mouth every 4 (four) hours as needed. 12/17/15   Garlon HatchetSanders, Lisa M, PA-C  ibuprofen (ADVIL,MOTRIN) 800 MG tablet Take 1 tablet  (800 mg total) by mouth 3 (three) times daily. 05/08/14   Elpidio AnisUpstill, Shari, PA-C  lidocaine (XYLOCAINE) 2 % solution Use as directed 15 mLs in the mouth or throat every 6 (six) hours as needed for mouth pain. Swish and spit as needed 08/21/15   Arvilla MeresMeyer, Ashley L, PA-C  naproxen (NAPROSYN) 500 MG tablet Take 1 tablet (500 mg total) by mouth 2 (two) times daily. 03/08/14   Gwyneth SproutPlunkett, Whitney, MD    Family History Family History  Problem Relation Age of Onset  . Diabetes Other     Social History Social History  Substance Use Topics  . Smoking status: Current Some Day Smoker  . Smokeless tobacco: Never Used  . Alcohol use Yes     Comment: occ     Allergies   Coconut oil; Peanuts [peanut oil]; and Sesame seed [sesame oil]   Review of Systems Review of Systems  Constitutional: Negative for chills, diaphoresis and fever.  HENT: Positive for dental problem, facial swelling and sore throat.   Eyes: Positive for visual disturbance.  Gastrointestinal: Negative for nausea and vomiting.  Skin: Negative for color change and wound.  Allergic/Immunologic: Negative for immunocompromised state.  Neurological: Positive for headaches. Negative for weakness and numbness.  Hematological: Does not bruise/bleed easily.  All other systems reviewed and are negative.    Physical Exam Updated Vital Signs BP (!) 147/99 (BP Location: Left Arm)   Pulse 62   Temp 98.2 F (36.8 C) (Oral)   Resp 18   Ht 5\' 10"  (  1.778 m)   Wt 165 lb (74.8 kg)   SpO2 100%   BMI 23.68 kg/m   Physical Exam  Constitutional: He is oriented to person, place, and time. He appears well-developed and well-nourished.  HENT:  Head: Normocephalic and atraumatic.  Right Ear: Tympanic membrane normal.  Left Ear: Tympanic membrane normal.  Mouth/Throat: Uvula is midline. No dental abscesses, uvula swelling or dental caries.  Tender gingiva over teeth numbers 13 and 14. No obvious swelling or dental caries.  Eyes: Pupils are equal,  round, and reactive to light. EOM are normal.  Neck: Normal range of motion. Neck supple. No JVD present.  Cardiovascular: Normal rate, regular rhythm and normal heart sounds.   No murmur heard. Pulmonary/Chest: Effort normal and breath sounds normal. He has no wheezes. He has no rales. He exhibits no tenderness.  Abdominal: Soft. Bowel sounds are normal. He exhibits no distension and no mass. There is no tenderness.  Musculoskeletal: Normal range of motion. He exhibits no edema.  Lymphadenopathy:    He has no cervical adenopathy.  Neurological: He is alert and oriented to person, place, and time. No cranial nerve deficit. He exhibits normal muscle tone. Coordination normal.  Skin: Skin is warm and dry. No rash noted.  Psychiatric: He has a normal mood and affect. His behavior is normal. Judgment and thought content normal.  Nursing note and vitals reviewed.    ED Treatments / Results  DIAGNOSTIC STUDIES: Oxygen Saturation is 100% on RA, NL by my interpretation.    COORDINATION OF CARE: 1:51 AM-Discussed next steps with pt. Pt verbalized understanding and is agreeable with the plan. Will order blood work and medication.   Labs (all labs ordered are listed, but only abnormal results are displayed) Labs Reviewed  BASIC METABOLIC PANEL  CBC WITH DIFFERENTIAL/PLATELET    Radiology Ct Head Wo Contrast  Result Date: 09/01/2016 CLINICAL DATA:  Left-sided headache beginning yesterday. Now with blurry vision in the left eye for 2 hours. No injury. EXAM: CT HEAD WITHOUT CONTRAST CT MAXILLOFACIAL WITHOUT CONTRAST TECHNIQUE: Multidetector CT imaging of the head and maxillofacial structures were performed using the standard protocol without intravenous contrast. Multiplanar CT image reconstructions of the maxillofacial structures were also generated. COMPARISON:  CT head and facial bones 01/26/2015 FINDINGS: CT HEAD FINDINGS Brain: No evidence of acute infarction, hemorrhage, hydrocephalus,  extra-axial collection or mass lesion/mass effect. Vascular: No hyperdense vessel or unexpected calcification. Skull: Normal. Negative for fracture or focal lesion. Other: None. CT MAXILLOFACIAL FINDINGS Osseous: No fracture or mandibular dislocation. No destructive process. Multiple dental caries with multiple focal areas of periapical lucency consistent with periodontal disease. Periodontal changes are most prominent in the right mandibular molars. Orbits: Negative. No traumatic or inflammatory finding. Sinuses: Retention cysts in the floor of the right maxillary antrum. Mild mucosal thickening in the paranasal sinuses. No acute air-fluid levels. Mastoid air cells are not opacified. Soft tissues: Tiny punctate calcifications demonstrated in the dermal tissues of the face possibly representing sebaceous cysts. No focal soft tissue swelling or hematoma. No inflammatory infiltration. Prominent submental lymph node measuring 9 mm diameter is probably reactive. IMPRESSION: 1. No acute intracranial abnormalities. 2. Mild chronic appearing mucosal changes in the paranasal sinuses. No evidence of acute sinusitis. 3. Skin calcifications possibly representing sebaceous cysts. 4. Multiple dental caries and periodontal disease. Electronically Signed   By: Burman Nieves M.D.   On: 09/01/2016 02:12   Ct Maxillofacial Wo Contrast  Result Date: 09/01/2016 CLINICAL DATA:  Left-sided headache beginning yesterday. Now  with blurry vision in the left eye for 2 hours. No injury. EXAM: CT HEAD WITHOUT CONTRAST CT MAXILLOFACIAL WITHOUT CONTRAST TECHNIQUE: Multidetector CT imaging of the head and maxillofacial structures were performed using the standard protocol without intravenous contrast. Multiplanar CT image reconstructions of the maxillofacial structures were also generated. COMPARISON:  CT head and facial bones 01/26/2015 FINDINGS: CT HEAD FINDINGS Brain: No evidence of acute infarction, hemorrhage, hydrocephalus,  extra-axial collection or mass lesion/mass effect. Vascular: No hyperdense vessel or unexpected calcification. Skull: Normal. Negative for fracture or focal lesion. Other: None. CT MAXILLOFACIAL FINDINGS Osseous: No fracture or mandibular dislocation. No destructive process. Multiple dental caries with multiple focal areas of periapical lucency consistent with periodontal disease. Periodontal changes are most prominent in the right mandibular molars. Orbits: Negative. No traumatic or inflammatory finding. Sinuses: Retention cysts in the floor of the right maxillary antrum. Mild mucosal thickening in the paranasal sinuses. No acute air-fluid levels. Mastoid air cells are not opacified. Soft tissues: Tiny punctate calcifications demonstrated in the dermal tissues of the face possibly representing sebaceous cysts. No focal soft tissue swelling or hematoma. No inflammatory infiltration. Prominent submental lymph node measuring 9 mm diameter is probably reactive. IMPRESSION: 1. No acute intracranial abnormalities. 2. Mild chronic appearing mucosal changes in the paranasal sinuses. No evidence of acute sinusitis. 3. Skin calcifications possibly representing sebaceous cysts. 4. Multiple dental caries and periodontal disease. Electronically Signed   By: Burman NievesWilliam  Stevens M.D.   On: 09/01/2016 02:12    Procedures Procedures (including critical care time)  Medications Ordered in ED Medications - No data to display   Initial Impression / Assessment and Plan / ED Course  I have reviewed the triage vital signs and the nursing notes.  Pertinent labs & imaging results that were available during my care of the patient were reviewed by me and considered in my medical decision making (see chart for details).  Left ear pain with normal exam of the ear. Headache with pattern suggesting muscle contraction headache versus migraine. Temporary visual disturbances consistent with migraine. Gingival pain which may be related to  occult dental caries, possible primary gingivitis. No sign of dental abscess. He is sent for CT which does show evidence of periodontal disease and dental caries, but no acute process. He was given a headache cocktail of normal saline, metoclopramide, diphenhydramine with excellent relief of his pain. Old records are reviewed, and he has no relevant past visits. He is discharged with prescriptions for amoxicillin and metoclopramide, advised to follow-up with a dentist. Given dental and financial resource guides.  Final Clinical Impressions(s) / ED Diagnoses   Final diagnoses:  Bad headache  Referred ear pain, left  Dental caries    New Prescriptions New Prescriptions   AMOXICILLIN (AMOXIL) 500 MG CAPSULE    Take 2 capsules (1,000 mg total) by mouth 2 (two) times daily.   METOCLOPRAMIDE (REGLAN) 10 MG TABLET    Take 1 tablet (10 mg total) by mouth every 6 (six) hours as needed for nausea (or headache).   I personally performed the services described in this documentation, which was scribed in my presence. The recorded information has been reviewed and is accurate.       Dione BoozeGlick, Nolen Lindamood, MD 09/01/16 [phone]

## 2016-09-01 NOTE — Discharge Instructions (Signed)
Your ear pain is related to your dental problems, and will not get better until you see a dentist.

## 2016-10-21 ENCOUNTER — Encounter (HOSPITAL_COMMUNITY): Payer: Self-pay | Admitting: *Deleted

## 2016-10-21 ENCOUNTER — Emergency Department (HOSPITAL_COMMUNITY)
Admission: EM | Admit: 2016-10-21 | Discharge: 2016-10-21 | Disposition: A | Discharge status: Home / Self Care | Payer: Self-pay | Attending: Emergency Medicine | Admitting: Emergency Medicine

## 2016-10-21 ENCOUNTER — Emergency Department (HOSPITAL_COMMUNITY): Payer: Self-pay

## 2016-10-21 DIAGNOSIS — L84 Corns and callosities: Secondary | ICD-10-CM | POA: Insufficient documentation

## 2016-10-21 DIAGNOSIS — Z91010 Allergy to peanuts: Secondary | ICD-10-CM | POA: Insufficient documentation

## 2016-10-21 DIAGNOSIS — M79672 Pain in left foot: Secondary | ICD-10-CM | POA: Insufficient documentation

## 2016-10-21 DIAGNOSIS — F17200 Nicotine dependence, unspecified, uncomplicated: Secondary | ICD-10-CM | POA: Insufficient documentation

## 2016-10-21 MED ORDER — NAPROXEN 500 MG PO TABS: 500.0000 mg | ORAL_TABLET | Freq: Two times a day (BID) | ORAL | 0 refills | Status: DC

## 2016-10-21 NOTE — ED Provider Notes (Signed)
[facility]-EMERGENCY DEPT Provider Note   CSN: [id number] Arrival date & time: 10/21/16  0825     History   Chief Complaint Chief Complaint  Patient presents with  . Foot Pain    HPI Tim Ford is a 31 y.o. male.  HPI Patient presents to ED for evaluation of callus on the left foot. He states that 6 months ago he felt as though there was an object in his boot which was now in the bottom of his foot. He states that over the past 6 months he has been picking at the area and using his nails and nail clippers to try to remove the foreign body. He now reports pain with weightbearing in the foot. He has not tried any medications to help with pain. He denies any drainage from site, bleeding, additional injury, numbness, fevers  History reviewed. No pertinent past medical history.  There are no active problems to display for this patient.   History reviewed. No pertinent surgical history.     Home Medications    Prior to Admission medications   Medication Sig Start Date End Date Taking? Authorizing Provider  amoxicillin (AMOXIL) 500 MG capsule Take 2 capsules (1,000 mg total) by mouth 2 (two) times daily. 09/01/16   Dione Booze, MD  ibuprofen (ADVIL,MOTRIN) 200 MG tablet Take 400 mg by mouth every 6 (six) hours as needed for headache, mild pain or moderate pain.    [provider]  metoCLOPramide (REGLAN) 10 MG tablet Take 1 tablet (10 mg total) by mouth every 6 (six) hours as needed for nausea (or headache). 09/01/16   Dione Booze, MD  naproxen (NAPROSYN) 500 MG tablet Take 1 tablet (500 mg total) by mouth 2 (two) times daily. 10/21/16   Dietrich Pates, PA-C    Family History Family History  Problem Relation Age of Onset  . Diabetes Other     Social History Social History  Substance Use Topics  . Smoking status: Current Some Day Smoker  . Smokeless tobacco: Never Used  . Alcohol use Yes     Comment: occ     Allergies   Coconut oil; Peanuts [peanut oil]; and  Sesame seed [sesame oil]   Review of Systems Review of Systems  Constitutional: Negative for chills and fever.  Musculoskeletal: Negative for arthralgias, joint swelling and myalgias.  Skin: Positive for color change. Negative for pallor, rash and wound.  Neurological: Negative for numbness.     Physical Exam Updated Vital Signs BP 135/85 (BP Location: Left Arm)   Pulse 67   Temp 98.2 F (36.8 C) (Oral)   Resp 16   SpO2 98%   Physical Exam  Constitutional: He appears well-developed and well-nourished. No distress.  Nontoxic appearing and in no acute distress. Ambulatory here in the ED.  HENT:  Head: Normocephalic and atraumatic.  Eyes: Conjunctivae and EOM are normal. No scleral icterus.  Neck: Normal range of motion.  Pulmonary/Chest: Effort normal. No respiratory distress.  Neurological: He is alert.  Skin: Lesion noted. No rash noted. He is not diaphoretic.  There is an approximately 1 cm hardened area consistent with a callus on the bottom of the left foot. No redness, drainage noted from site.  Psychiatric: He has a normal mood and affect.  Nursing note and vitals reviewed.    ED Treatments / Results  Labs (all labs ordered are listed, but only abnormal results are displayed) Labs Reviewed - No data to display  EKG  EKG Interpretation None  Radiology Dg Foot 2 Views Left  Result Date: 10/21/2016 CLINICAL DATA:  Stepped on glass previously with possible foreign body EXAM: LEFT FOOT - 2 VIEW COMPARISON:  None. FINDINGS: No acute fracture or dislocation is noted. No definitive radiopaque foreign body is noted. Shards of glass are commonly radiolucent. IMPRESSION: No acute abnormality noted.  No definitive foreign body is seen. Electronically Signed   By: Alcide Clever M.D.   On: 10/21/2016 09:16    Procedures Procedures (including critical care time)  Medications Ordered in ED Medications - No data to display   Initial Impression / Assessment and  Plan / ED Course  I have reviewed the triage vital signs and the nursing notes.  Pertinent labs & imaging results that were available during my care of the patient were reviewed by me and considered in my medical decision making (see chart for details).     Patient presents to ED for evaluation of possible foreign body at the bottom of left foot. Reports he's had a sensation of foreign body for the past 6 months but has tried picking at the area with his nails and nail clippers in an attempt to remove it. On physical exam there is a clearly visible callus at the bottom of the left foot with [place] skin. There is no drainage, color change noted that would concern me for infection. He is ambulatory here in the ED. Advised patient to take anti-inflammatories and to keep the area moist. Patient appears stable for discharge at this time. Strict return precautions given.  Final Clinical Impressions(s) / ED Diagnoses   Final diagnoses:  Foot pain, left  Pre-ulcerative corn or callous    New Prescriptions New Prescriptions   NAPROXEN (NAPROSYN) 500 MG TABLET    Take 1 tablet (500 mg total) by mouth 2 (two) times daily.     Dietrich Pates, PA-C 10/21/16 1610    Linwood Dibbles, MD 10/25/16 [phone]

## 2016-10-21 NOTE — ED Notes (Signed)
Has a small area  Pinky nail size x 5-6 motns and he has been picking at it now wants to see what it is, looks like small callus that he has picked at bottom of heel left foot

## 2016-10-21 NOTE — Discharge Instructions (Signed)
Please read attached information regarding your condition. Keep area moist and use gauze as needed. Use gel insole and shoe at work. Take naproxen for the next 2-3 days to help with pain and inflammation. Return to ED for worsening pain, signs of infection including redness, drainage, fevers, numbness or trouble walking.

## 2016-10-21 NOTE — ED Triage Notes (Signed)
To ED for eval of right foot pain. 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 Over the past couple of weeks pt noticed a sore at the area of pain and can feel something hard under skin. Now working for [employer] and on feet all day making pain worse.

## 2017-06-22 ENCOUNTER — Emergency Department (HOSPITAL_COMMUNITY)
Admission: EM | Admit: 2017-06-22 | Discharge: 2017-06-22 | Disposition: A | Discharge status: Home / Self Care | Payer: Self-pay | Attending: Emergency Medicine | Admitting: Emergency Medicine

## 2017-06-22 ENCOUNTER — Emergency Department (HOSPITAL_COMMUNITY): Payer: Self-pay

## 2017-06-22 DIAGNOSIS — N390 Urinary tract infection, site not specified: Secondary | ICD-10-CM | POA: Insufficient documentation

## 2017-06-22 DIAGNOSIS — F17200 Nicotine dependence, unspecified, uncomplicated: Secondary | ICD-10-CM | POA: Insufficient documentation

## 2017-06-22 DIAGNOSIS — I8290 Acute embolism and thrombosis of unspecified vein: Secondary | ICD-10-CM | POA: Insufficient documentation

## 2017-06-22 DIAGNOSIS — N50811 Right testicular pain: Secondary | ICD-10-CM | POA: Insufficient documentation

## 2017-06-22 DIAGNOSIS — N50819 Testicular pain, unspecified: Secondary | ICD-10-CM

## 2017-06-22 DIAGNOSIS — Z91010 Allergy to peanuts: Secondary | ICD-10-CM | POA: Insufficient documentation

## 2017-06-22 LAB — CBC WITH DIFFERENTIAL/PLATELET
Basophils Absolute: 0 10*3/uL (ref 0.0–0.1)
Basophils Relative: 0 %
Eosinophils Absolute: 0.1 10*3/uL (ref 0.0–0.7)
Eosinophils Relative: 1 %
HEMATOCRIT: 40.8 % (ref 39.0–52.0)
Hemoglobin: 13.3 g/dL (ref 13.0–17.0)
LYMPHS PCT: 39 %
Lymphs Abs: 2.3 10*3/uL (ref 0.7–4.0)
MCH: 28.5 pg (ref 26.0–34.0)
MCHC: 32.6 g/dL (ref 30.0–36.0)
MCV: 87.4 fL (ref 78.0–100.0)
MONO ABS: 0.5 10*3/uL (ref 0.1–1.0)
MONOS PCT: 8 %
NEUTROS ABS: 3 10*3/uL (ref 1.7–7.7)
Neutrophils Relative %: 52 %
Platelets: 253 10*3/uL (ref 150–400)
RBC: 4.67 MIL/uL (ref 4.22–5.81)
RDW: 13.3 % (ref 11.5–15.5)
WBC: 5.8 10*3/uL (ref 4.0–10.5)

## 2017-06-22 LAB — URINALYSIS, ROUTINE W REFLEX MICROSCOPIC
Bilirubin Urine: NEGATIVE
Glucose, UA: NEGATIVE mg/dL
Ketones, ur: 5 mg/dL — AB
Nitrite: NEGATIVE
Protein, ur: 30 mg/dL — AB
Specific Gravity, Urine: 1.031 — ABNORMAL HIGH (ref 1.005–1.030)
pH: 6 (ref 5.0–8.0)

## 2017-06-22 LAB — BASIC METABOLIC PANEL
ANION GAP: 10 (ref 5–15)
BUN: 12 mg/dL (ref 6–20)
CALCIUM: 9.3 mg/dL (ref 8.9–10.3)
CO2: 26 mmol/L (ref 22–32)
CREATININE: 0.87 mg/dL (ref 0.61–1.24)
Chloride: 104 mmol/L (ref 101–111)
GFR calc Af Amer: >60 mL/min (ref >60)
GFR calc non Af Amer: >60 mL/min (ref >60)
GLUCOSE: 100 mg/dL — AB (ref 65–99)
Potassium: 3.5 mmol/L (ref 3.5–5.1)
Sodium: 140 mmol/L (ref 135–145)

## 2017-06-22 MED ORDER — IOPAMIDOL (ISOVUE-300) INJECTION 61%
100.0000 mL | Freq: Once | INTRAVENOUS | Status: AC | PRN
Start: 2017-06-22 — End: 2017-06-22
  Administered 2017-06-22: 100 mL via INTRAVENOUS

## 2017-06-22 MED ORDER — CEPHALEXIN 500 MG PO CAPS: 500.0000 mg | ORAL_CAPSULE | Freq: Four times a day (QID) | ORAL | 0 refills | Status: DC

## 2017-06-22 MED ORDER — NAPROXEN 500 MG PO TABS: 500.0000 mg | ORAL_TABLET | Freq: Two times a day (BID) | ORAL | 0 refills | Status: DC

## 2017-06-22 MED ORDER — SODIUM CHLORIDE 0.9 % IV SOLN
INTRAVENOUS | Status: DC
  Administered 2017-06-22: 17:00:00 via INTRAVENOUS

## 2017-06-22 MED ORDER — SODIUM CHLORIDE 0.9 % IV SOLN
1.0000 g | INTRAVENOUS | Status: DC
  Administered 2017-06-22: 1 g via INTRAVENOUS
  Filled 2017-06-22: qty 10

## 2017-06-22 NOTE — ED Triage Notes (Signed)
Patient c/o right testicle pain x4 days noticed after heavy lifting at work. Reports pain increases with movement and extends to abdomen. Denies changes in bowel or bladder. Ambulatory.

## 2017-06-22 NOTE — Discharge Instructions (Addendum)
You have a blood clot in one of the superficial veins in your right scrotum. Wear close-fitting undergarments and you can use warm compresses for pain control. Get rechecked immediately if you develop fevers, uncontrolled pain, severe swelling, cannot urinate or new concerning symptoms.

## 2017-06-22 NOTE — ED Provider Notes (Signed)
Patient visit shared. Here for evaluation of testicular pain. 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 CT abdomen and pelvis obtained per urology recommendations. CT scan with evidence of varicocele but no additional abnormalities. The patient's pain is well-controlled in the department. He enjoys concerning for UTI, will treat with antibiotics. Discussed again with neurology treatment plan. Plan to DC home with outpatient follow-up, no recommendation for anticoagulation at this time. Discussed with patient home care, outpatient follow-up and return precautions.   Tilden Fossa, MD 06/22/17 1859

## 2017-06-22 NOTE — ED Provider Notes (Signed)
[facility]-EMERGENCY DEPT Provider Note   CSN: [id number] Arrival date & time: 06/22/17  1204     History   Chief Complaint Chief Complaint  Patient presents with  . Testicle Pain    HPI Tim Ford is a 32 y.o. male.  32 year old male presents with 4 days of right testicular pain radiates into his right groin.  No dysuria or hematuria.  No penile drainage or discharge.  No flank pain noted.  Pain is worse with movement patient does heavy lifting at work.  No prior history of same.  Pain characterizes dull and no treatment used prior to arrival     No past medical history on file.  There are no active problems to display for this patient.   No past surgical history on file.      Home Medications    Prior to Admission medications   Medication Sig Start Date End Date Taking? Authorizing Provider  acetaminophen (TYLENOL) 325 MG tablet Take 650 mg by mouth every 6 (six) hours as needed for moderate pain.   Yes [provider]  hydroxypropyl methylcellulose / hypromellose (ISOPTO TEARS / GONIOVISC) 2.5 % ophthalmic solution Place 1 drop into both eyes 3 (three) times daily as needed for dry eyes.   Yes [provider]  ibuprofen (ADVIL,MOTRIN) 200 MG tablet Take 400 mg by mouth every 6 (six) hours as needed for headache, mild pain or moderate pain.   Yes [provider]  loratadine (CLARITIN) 10 MG tablet Take 10 mg by mouth daily as needed for allergies.   Yes [provider]  amoxicillin (AMOXIL) 500 MG capsule Take 2 capsules (1,000 mg total) by mouth 2 (two) times daily. Patient not taking: Reported on 06/22/2017 09/01/16   Dione Booze, MD  metoCLOPramide (REGLAN) 10 MG tablet Take 1 tablet (10 mg total) by mouth every 6 (six) hours as needed for nausea (or headache). Patient not taking: Reported on 06/22/2017 09/01/16   Dione Booze, MD  naproxen (NAPROSYN) 500 MG tablet Take 1 tablet (500 mg total) by mouth 2 (two)  times daily. Patient not taking: Reported on 06/22/2017 10/21/16   Dietrich Pates, PA-C    Family History Family History  Problem Relation Age of Onset  . Diabetes Other     Social History Social History   Tobacco Use  . Smoking status: Current Some Day Smoker  . Smokeless tobacco: Never Used  Substance Use Topics  . Alcohol use: Yes    Comment: occ  . Drug use: No     Allergies   Coconut oil; Peanuts [peanut oil]; and Sesame seed [sesame oil]   Review of Systems Review of Systems  All other systems reviewed and are negative.    Physical Exam Updated Vital Signs BP 138/89 (BP Location: Right Arm)   Pulse 99   Temp 98.4 F (36.9 C) (Oral)   Resp 18   SpO2 96%   Physical Exam  Constitutional: He is oriented to person, place, and time. He appears well-developed and well-nourished.  Non-toxic appearance. No distress.  HENT:  Head: Normocephalic and atraumatic.  Eyes: Pupils are equal, round, and reactive to light. Conjunctivae, EOM and lids are normal.  Neck: Normal range of motion. Neck supple. No tracheal deviation present. No thyroid mass present.  Cardiovascular: Normal rate, regular rhythm and normal heart sounds. Exam reveals no gallop.  No murmur heard. Pulmonary/Chest: Effort normal and breath sounds normal. No stridor. No respiratory distress. He has no decreased breath  sounds. He has no wheezes. He has no rhonchi. He has no rales.  Abdominal: Soft. Normal appearance and bowel sounds are normal. He exhibits no distension. There is no tenderness. There is no rebound and no CVA tenderness. A hernia is present. Hernia confirmed positive in the right inguinal area.  Genitourinary: Cremasteric reflex is present. Right testis shows tenderness. Right testis shows no mass. Circumcised.     Musculoskeletal: Normal range of motion. He exhibits no edema or tenderness.  Lymphadenopathy: No inguinal adenopathy noted on the right side.  Neurological: He is alert and  oriented to person, place, and time. He has normal strength. No cranial nerve deficit or sensory deficit. GCS eye subscore is 4. GCS verbal subscore is 5. GCS motor subscore is 6.  Skin: Skin is warm and dry. No abrasion and no rash noted.  Psychiatric: He has a normal mood and affect. His speech is normal and behavior is normal.  Nursing note and vitals reviewed.    ED Treatments / Results  Labs (all labs ordered are listed, but only abnormal results are displayed) Labs Reviewed  URINE CULTURE  URINALYSIS, ROUTINE W REFLEX MICROSCOPIC    EKG None  Radiology No results found.  Procedures Procedures (including critical care time)  Medications Ordered in ED Medications - No data to display   Initial Impression / Assessment and Plan / ED Course  I have reviewed the triage vital signs and the nursing notes.  Pertinent labs & imaging results that were available during my care of the patient were reviewed by me and considered in my medical decision making (see chart for details).     She has evidence of urinary tract infection on UA.  Ultrasound showed a thrombosed gonadal vein.  This was discussed with urology on-call recommends patient have abdominal CT and recontact if acute findings noted.  Care signed out to Dr. Madilyn Hook  Final Clinical Impressions(s) / ED Diagnoses   Final diagnoses:  None    ED Discharge Orders    None       Lorre Nick, MD 06/22/17 1620

## 2017-06-22 NOTE — ED Notes (Signed)
US at bedside

## 2017-06-23 LAB — URINE CULTURE: Culture: NO GROWTH

## 2017-11-10 ENCOUNTER — Emergency Department (HOSPITAL_COMMUNITY): Payer: Self-pay

## 2017-11-10 ENCOUNTER — Emergency Department (HOSPITAL_COMMUNITY)
Admission: EM | Admit: 2017-11-10 | Discharge: 2017-11-10 | Disposition: A | Discharge status: Home / Self Care | Payer: Self-pay | Attending: Emergency Medicine | Admitting: Emergency Medicine

## 2017-11-10 DIAGNOSIS — N433 Hydrocele, unspecified: Secondary | ICD-10-CM

## 2017-11-10 DIAGNOSIS — R1084 Generalized abdominal pain: Secondary | ICD-10-CM | POA: Insufficient documentation

## 2017-11-10 DIAGNOSIS — R109 Unspecified abdominal pain: Secondary | ICD-10-CM

## 2017-11-10 DIAGNOSIS — F17210 Nicotine dependence, cigarettes, uncomplicated: Secondary | ICD-10-CM | POA: Insufficient documentation

## 2017-11-10 LAB — URINALYSIS, ROUTINE W REFLEX MICROSCOPIC
BILIRUBIN URINE: NEGATIVE
Glucose, UA: NEGATIVE mg/dL
Ketones, ur: 5 mg/dL — AB
Leukocytes, UA: NEGATIVE
NITRITE: NEGATIVE
PH: 6 (ref 5.0–8.0)
Protein, ur: 30 mg/dL — AB
SPECIFIC GRAVITY, URINE: 1.029 (ref 1.005–1.030)

## 2017-11-10 NOTE — ED Provider Notes (Signed)
[facility]-EMERGENCY DEPT Provider Note   CSN: [id number] Arrival date & time: 11/10/17  4098     History   Chief Complaint Chief Complaint  Patient presents with  . Abdominal Pain    HPI Tim Ford is a 32 y.o. male.  HPI Patient is a 32 year old male presents the emergency department with right-sided abdominal pain that is been intermittent over the past week.  No urinary frequency or dysuria.  Denies nausea vomiting.  No changes in bowel movements.  Pain is 6 out of 10.  Pain is in his right abdomen and radiates towards his right groin.  Patient has a known history of hydroceles.  But denies significant scrotal or testicular pain at this time.  Pain was waxing and waning today.  No fevers.  No other complaints.  Symptoms are mild to moderate in severity   No past medical history on file.  There are no active problems to display for this patient.   No past surgical history on file.      Home Medications    Prior to Admission medications   Medication Sig Start Date End Date Taking? Authorizing Provider  acetaminophen (TYLENOL) 325 MG tablet Take 650 mg by mouth every 6 (six) hours as needed for moderate pain.   Yes [provider]  hydroxypropyl methylcellulose / hypromellose (ISOPTO TEARS / GONIOVISC) 2.5 % ophthalmic solution Place 1 drop into both eyes 3 (three) times daily as needed for dry eyes.   Yes [provider]  ibuprofen (ADVIL,MOTRIN) 200 MG tablet Take 400 mg by mouth every 6 (six) hours as needed for headache, mild pain or moderate pain.   Yes [provider]  loratadine (CLARITIN) 10 MG tablet Take 10 mg by mouth daily as needed for allergies.   Yes [provider]  amoxicillin (AMOXIL) 500 MG capsule Take 2 capsules (1,000 mg total) by mouth 2 (two) times daily. Patient not taking: Reported on 06/22/2017 09/01/16   Dione Booze, MD  cephALEXin (KEFLEX) 500 MG capsule Take 1 capsule (500 mg total) by  mouth 4 (four) times daily. Patient not taking: Reported on 11/10/2017 06/22/17   Tilden Fossa, MD  metoCLOPramide (REGLAN) 10 MG tablet Take 1 tablet (10 mg total) by mouth every 6 (six) hours as needed for nausea (or headache). Patient not taking: Reported on 06/22/2017 09/01/16   Dione Booze, MD  naproxen (NAPROSYN) 500 MG tablet Take 1 tablet (500 mg total) by mouth 2 (two) times daily with a meal. Patient not taking: Reported on 11/10/2017 06/22/17   Tilden Fossa, MD    Family History Family History  Problem Relation Age of Onset  . Diabetes Other     Social History Social History   Tobacco Use  . Smoking status: Current Some Day Smoker  . Smokeless tobacco: Never Used  Substance Use Topics  . Alcohol use: Yes    Comment: occ  . Drug use: No     Allergies   Coconut oil; Peanuts [peanut oil]; and Sesame seed [sesame oil]   Review of Systems Review of Systems  All other systems reviewed and are negative.    Physical Exam Updated Vital Signs BP 138/71   Pulse (!) 55   Temp 98.4 F (36.9 C) (Oral)   Resp 16   SpO2 97%   Physical Exam  Constitutional: He is oriented to person, place, and time. He appears well-developed and well-nourished.  HENT:  Head: Normocephalic and atraumatic.  Eyes: EOM are normal.  Neck: Normal range of motion.  Cardiovascular: Normal rate, regular rhythm, normal heart sounds and intact distal pulses.  Pulmonary/Chest: Effort normal and breath sounds normal. No respiratory distress.  Abdominal: Soft. He exhibits no distension. There is no tenderness.  Genitourinary:  Genitourinary Comments: Normal circumcised penis.  No penile tenderness.  No scrotal changes.  No overt abnormalities noted on his testicular exam.  No inguinal hernia palpable on the right  Musculoskeletal: Normal range of motion.  Neurological: He is alert and oriented to person, place, and time.  Skin: Skin is warm and dry.  Psychiatric: He has a normal mood and  affect. Judgment normal.  Nursing note and vitals reviewed.    ED Treatments / Results  Labs (all labs ordered are listed, but only abnormal results are displayed) Labs Reviewed  URINALYSIS, ROUTINE W REFLEX MICROSCOPIC - Abnormal; Notable for the following components:      Result Value   Hgb urine dipstick MODERATE (*)    Ketones, ur 5 (*)    Protein, ur 30 (*)    Bacteria, UA RARE (*)    All other components within normal limits    EKG None  Radiology Ct Renal Stone Study  Result Date: 11/10/2017 CLINICAL DATA:  Right flank pain for 1 week with hematuria. EXAM: CT ABDOMEN AND PELVIS WITHOUT CONTRAST TECHNIQUE: Multidetector CT imaging of the abdomen and pelvis was performed following the standard protocol without IV contrast. COMPARISON:  06/22/2017 FINDINGS: Lower chest: Clear lung bases. Normal heart size without pericardial or pleural effusion. Hepatobiliary: Normal liver. Normal gallbladder, without biliary ductal dilatation. Pancreas: Normal, without mass or ductal dilatation. Spleen: Normal in size, without focal abnormality. Adrenals/Urinary Tract: Normal adrenal glands. No renal calculi or hydronephrosis. No hydroureter or ureteric calculi. No bladder calculi. Stomach/Bowel: Normal stomach, without wall thickening. Normal caliber of large and small bowel loops. Appendix not visualized. No free intraperitoneal air. Vascular/Lymphatic: Normal caliber of the aorta and branch vessels. No abdominopelvic adenopathy. Reproductive: Normal prostate. Other: No significant free fluid. Musculoskeletal: No acute osseous abnormality. IMPRESSION: 1.  No urinary tract calculi or hydronephrosis. 2. Lack of visualization of the appendix. Electronically Signed   By: Jeronimo Greaves M.D.   On: 11/10/2017 09:22    Procedures Procedures (including critical care time)  Medications Ordered in ED Medications - No data to display   Initial Impression / Assessment and Plan / ED Course  I have reviewed  the triage vital signs and the nursing notes.  Pertinent labs & imaging results that were available during my care of the patient were reviewed by me and considered in my medical decision making (see chart for details).     Patient is overall well-appearing.  Mild hematuria noted CT stone study performed.  CT stone study demonstrates no hydronephrosis or ureteral stone.  Doubt torsion.  Overall well-appearing.  Discharged home in good condition.  Primary care follow-up.  Patient has known history of bilateral hydroceles.  He is requesting referral to urology for this.  Final Clinical Impressions(s) / ED Diagnoses   Final diagnoses:  None    ED Discharge Orders    None       Azalia Bilis, MD 11/10/17 1003

## 2017-11-10 NOTE — ED Triage Notes (Signed)
RUQ RLQ abdominal pain that started x 1 week ago. Denies any urinary symptoms, n/v or trouble with bowel movements. Pain 6//10.

## 2017-11-10 NOTE — ED Notes (Signed)
ED Provider at bedside. 

## 2018-03-27 ENCOUNTER — Emergency Department (HOSPITAL_COMMUNITY)
Admission: EM | Admit: 2018-03-27 | Discharge: 2018-03-27 | Disposition: A | Discharge status: Home / Self Care | Payer: Self-pay | Attending: Emergency Medicine | Admitting: Emergency Medicine

## 2018-03-27 DIAGNOSIS — Z5321 Procedure and treatment not carried out due to patient leaving prior to being seen by health care provider: Secondary | ICD-10-CM | POA: Insufficient documentation

## 2018-03-27 DIAGNOSIS — R69 Illness, unspecified: Secondary | ICD-10-CM | POA: Insufficient documentation

## 2018-03-27 NOTE — ED Notes (Signed)
No answer x2 

## 2018-03-27 NOTE — ED Notes (Signed)
Called x 1 no answer

## 2018-12-27 IMAGING — CT CT RENAL STONE PROTOCOL
2 of 4 series · 16 of 46 positions shown, 18 images · non-contrast
Comparison: 06/22/2017

CLINICAL DATA: Right flank pain for 1 week with hematuria.

EXAM:
CT ABDOMEN AND PELVIS WITHOUT CONTRAST
TECHNIQUE: Multidetector CT imaging of the abdomen and pelvis was performed
following the standard protocol without IV contrast.

[Series 2: axial st · axial · 0.65mm/px · z∈[-506,-171]mm · 13 of 75 slices shown, 15 images]
[im 4/75  soft-tissue]
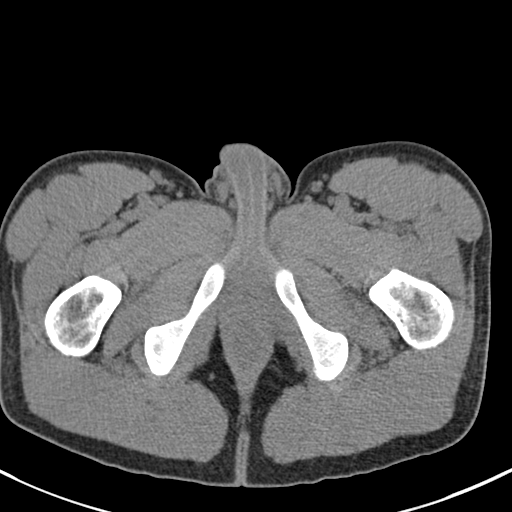
[im 4/75  bone]
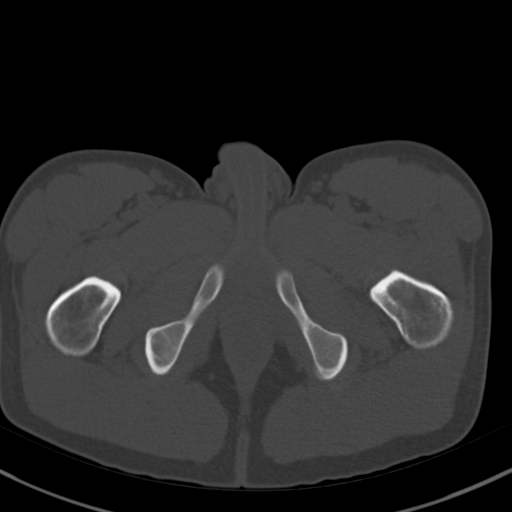
[im 11/75  soft-tissue]
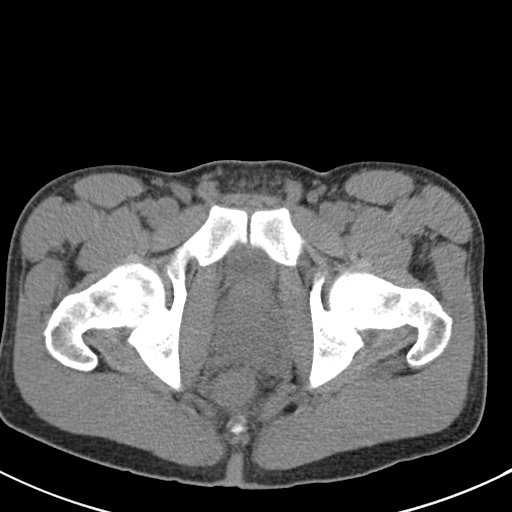
[im 15/75  soft-tissue]
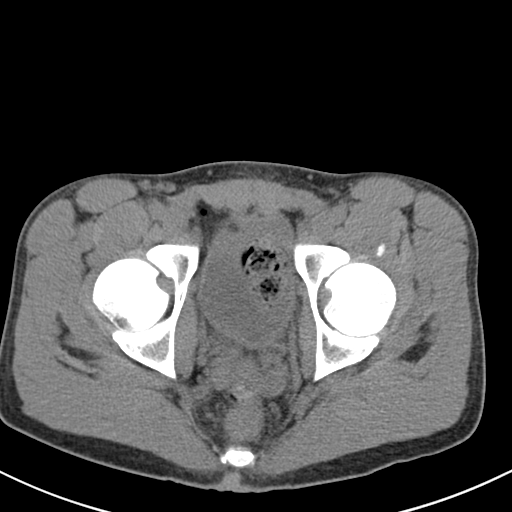
[im 22/75  soft-tissue]
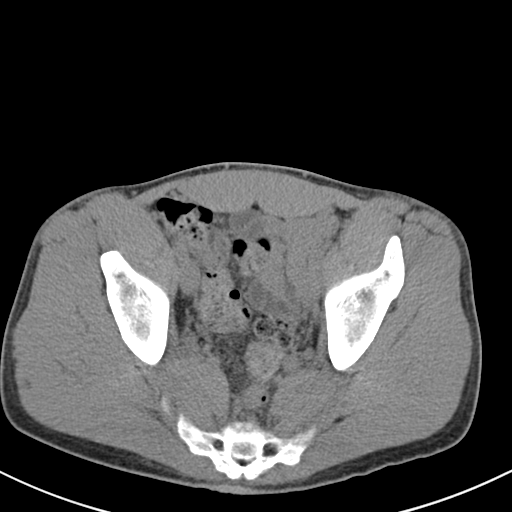
[im 25/75  soft-tissue]
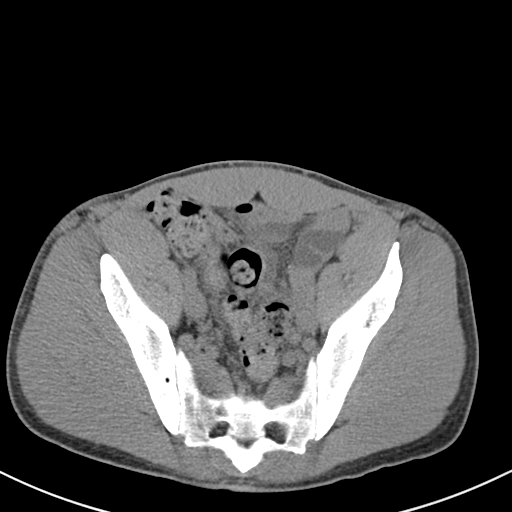
[im 32/75  soft-tissue]
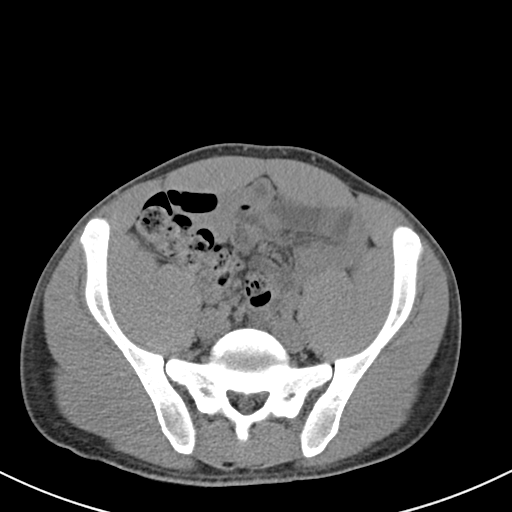
[im 39/75  soft-tissue]
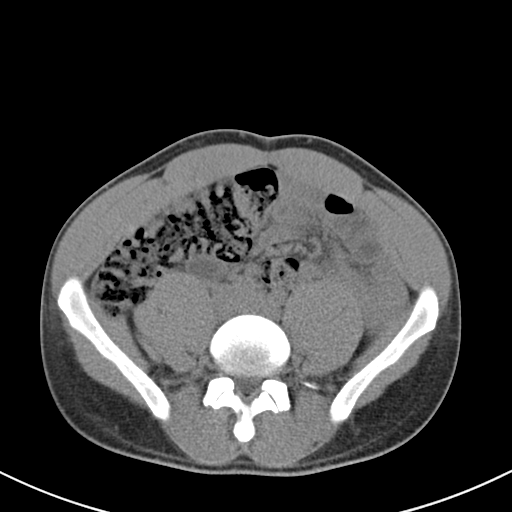
[im 43/75  soft-tissue]
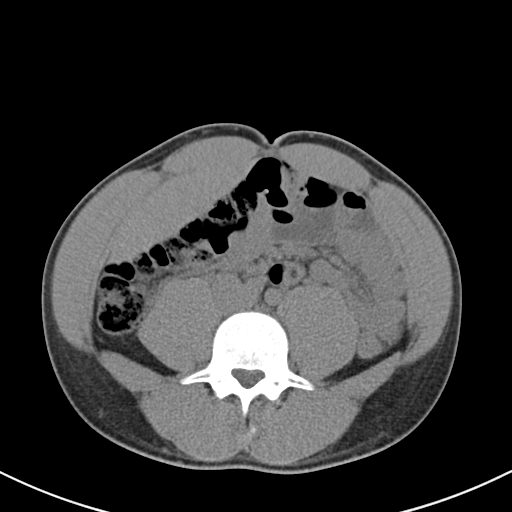
[im 50/75  soft-tissue]
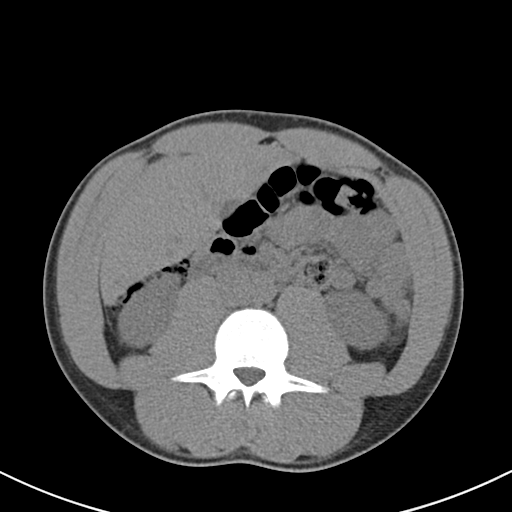
[im 50/75  bone]
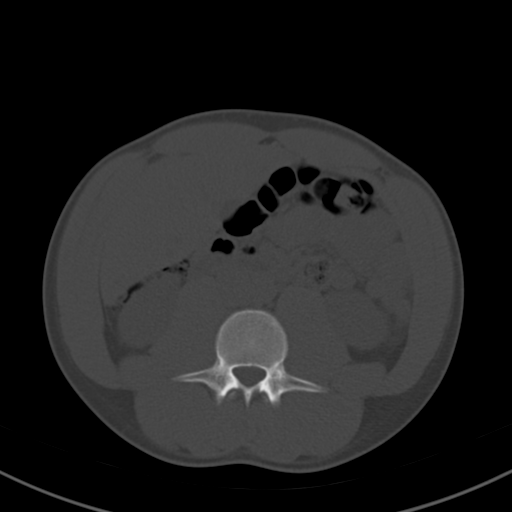
[im 53/75  soft-tissue]
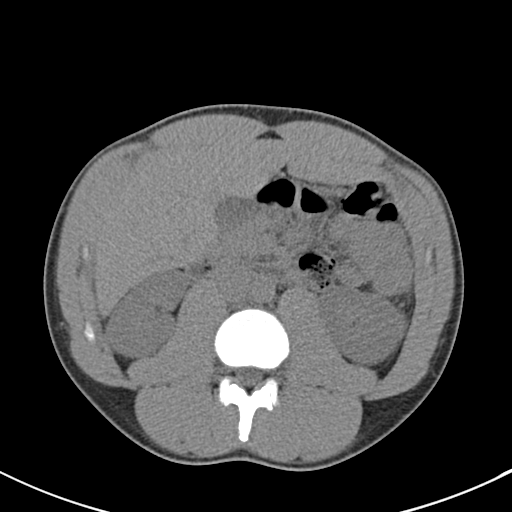
[im 60/75  soft-tissue]
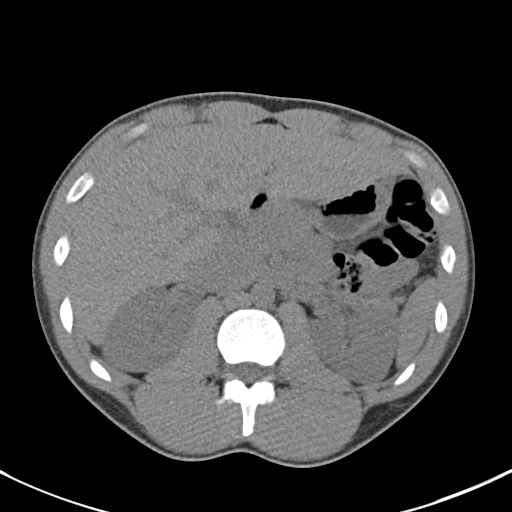
[im 64/75  soft-tissue]
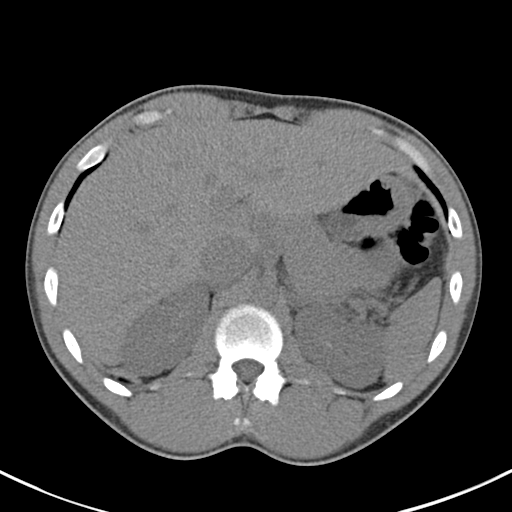
[im 71/75  soft-tissue]
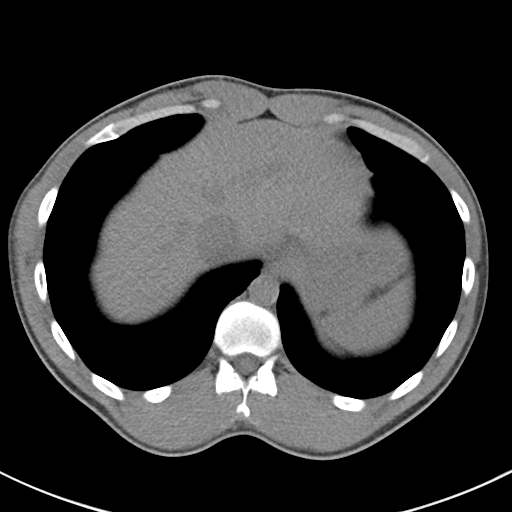

[Series 5: coronal · coronal · 0.63mm/px · 3 of 130 slices shown]
[im 44/130  soft-tissue]
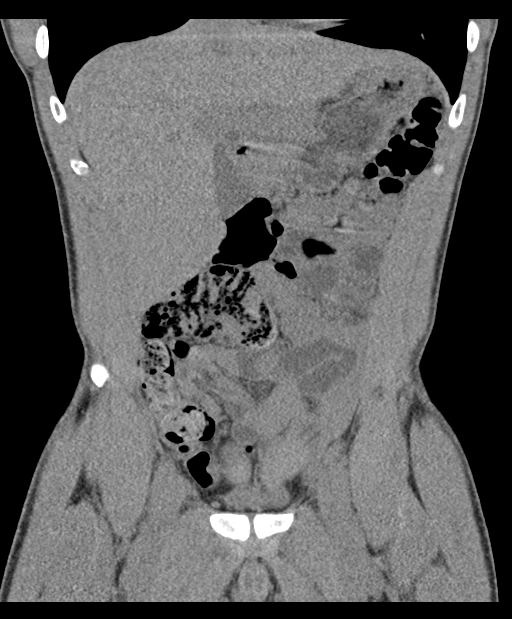
[im 58/130  soft-tissue]
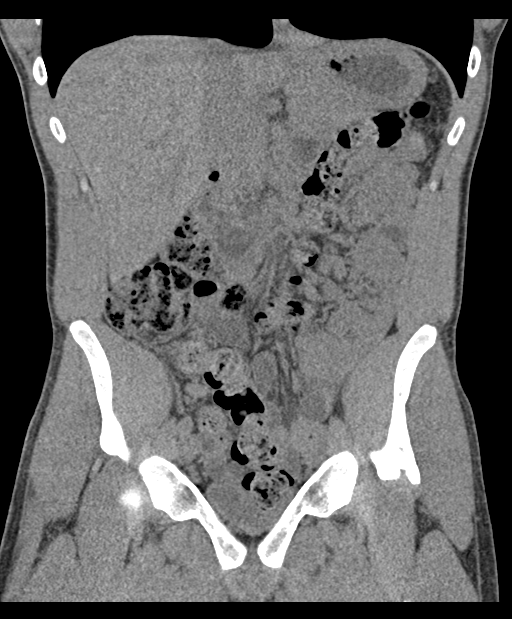
[im 72/130  soft-tissue]
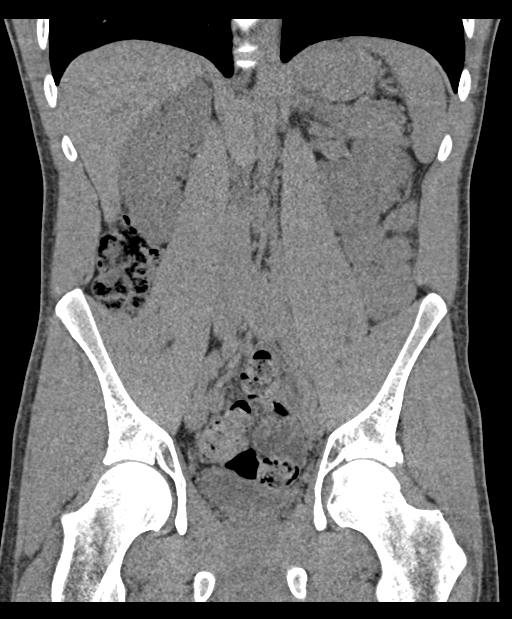

[16 of 46 positions shown; findings below may reference images not displayed]

FINDINGS: Lower chest: Clear lung bases. Normal heart size without pericardial
or pleural effusion.

Hepatobiliary: Normal liver. Normal gallbladder, without biliary
ductal dilatation.

Pancreas: Normal, without mass or ductal dilatation.

Spleen: Normal in size, without focal abnormality.

Adrenals/Urinary Tract: Normal adrenal glands. No renal calculi or
hydronephrosis. No hydroureter or ureteric calculi. No bladder
calculi.

Stomach/Bowel: Normal stomach, without wall thickening. Normal
caliber of large and small bowel loops. Appendix not visualized. No
free intraperitoneal air.

Vascular/Lymphatic: Normal caliber of the aorta and branch vessels.
No abdominopelvic adenopathy.

Reproductive: Normal prostate.

Other: No significant free fluid.

Musculoskeletal: No acute osseous abnormality.
IMPRESSION: 1.  No urinary tract calculi or hydronephrosis.
2. Lack of visualization of the appendix.

## 2019-03-16 ENCOUNTER — Encounter: Payer: Self-pay | Admitting: Emergency Medicine

## 2019-03-16 ENCOUNTER — Other Ambulatory Visit: Payer: Self-pay

## 2019-03-16 ENCOUNTER — Ambulatory Visit
Admission: EM | Admit: 2019-03-16 | Discharge: 2019-03-16 | Disposition: A | Discharge status: Home / Self Care | Payer: Self-pay | Attending: Emergency Medicine | Admitting: Emergency Medicine

## 2019-03-16 DIAGNOSIS — L089 Local infection of the skin and subcutaneous tissue, unspecified: Secondary | ICD-10-CM

## 2019-03-16 DIAGNOSIS — L723 Sebaceous cyst: Secondary | ICD-10-CM

## 2019-03-16 MED ORDER — AMOXICILLIN-POT CLAVULANATE 875-125 MG PO TABS: 1.0000 | ORAL_TABLET | Freq: Two times a day (BID) | ORAL | 0 refills | Status: DC

## 2019-03-16 NOTE — Discharge Instructions (Signed)
Keep area(s) clean and dry. °Apply hot compress / towel for 5-10 minutes 3-5 times daily. °Take antibiotic as prescribed with food - important to complete course. °Return for worsening pain, redness, swelling, discharge, fever. ° °Helpful prevention tips: °Keep nails short to avoid secondary skin infections. °Use new, clean razors when shaving. °Avoid antiperspirants - look for deodorants without aluminum. °Avoid wearing underwire bras as this can irritate the area further.  °

## 2019-03-16 NOTE — ED Provider Notes (Signed)
[name]    CSN: [id number] Arrival date & time: 03/16/19  8676      History   Chief Complaint Chief Complaint  Patient presents with  . Otalgia    HPI Tim Ford is a 34 y.o. male without significant medical history presenting for right-sided posterior ear pain for the last 2 weeks.  States that he has had intermittent right-sided headache around area of concern that is dull in nature, nonradiating without visual or auditory changes.  Patient has been using isopropyl alcohol on lesion without significant relief.  Patient feels size of lesion significantly increased last week, though has been stable since.  Patient does get his hair done: States he had it done 1 week ago, 2 weeks before that as well.  Denies other known inciting events, bug bite, trauma to the area.  Denies scalp tenderness, swelling, fever, myalgias, arthralgias.   History reviewed. No pertinent past medical history.  There are no problems to display for this patient.   History reviewed. No pertinent surgical history.     Home Medications    Prior to Admission medications   Medication Sig Start Date End Date Taking? Authorizing Provider  acetaminophen (TYLENOL) 325 MG tablet Take 650 mg by mouth every 6 (six) hours as needed for moderate pain.    [provider]  amoxicillin-clavulanate (AUGMENTIN) 875-125 MG tablet Take 1 tablet by mouth every 12 (twelve) hours. 03/16/19   Hall-Potvin, [place], PA-C  ibuprofen (ADVIL,MOTRIN) 200 MG tablet Take 400 mg by mouth every 6 (six) hours as needed for headache, mild pain or moderate pain.    [provider]  loratadine (CLARITIN) 10 MG tablet Take 10 mg by mouth daily as needed for allergies.    [provider]  metoCLOPramide (REGLAN) 10 MG tablet Take 1 tablet (10 mg total) by mouth every 6 (six) hours as needed for nausea (or headache). Patient not taking: Reported on 06/22/2017 09/01/16 03/16/19  Dione Booze, MD     Family History Family History  Problem Relation Age of Onset  . Diabetes Other   . Schizophrenia Mother     Social History Social History   Tobacco Use  . Smoking status: Current Some Day Smoker  . Smokeless tobacco: Never Used  Substance Use Topics  . Alcohol use: Yes    Comment: occ  . Drug use: No     Allergies   Coconut oil, Peanuts [peanut oil], and Sesame seed [sesame oil]   Review of Systems As per HPI   Physical Exam Triage Vital Signs ED Triage Vitals  Enc Vitals Group     BP      Pulse      Resp      Temp      Temp src      SpO2      Weight      Height      Head Circumference      Peak Flow      Pain Score      Pain Loc      Pain Edu?      Excl. in GC?    No data found.  Updated Vital Signs BP 123/69 (BP Location: Left Arm)   Pulse 73   Temp 97.8 F (36.6 C) (Temporal)   Resp 16   SpO2 96%   Visual Acuity Right Eye Distance:   Left Eye Distance:   Bilateral Distance:    Right Eye Near:   Left Eye Near:  Bilateral Near:     Physical Exam Constitutional:      General: He is not in acute distress.    Appearance: He is normal weight. He is not ill-appearing.  HENT:     Head: Normocephalic and atraumatic.     Right Ear: Tympanic membrane, ear canal and external ear normal.     Left Ear: Tympanic membrane, ear canal and external ear normal.     Ears:     Comments: <1cm seb cyst posterior to auricle of right ear.  Mild surrounding erythema, no open wound or active discharge.  Exquisite TTP without fluctuance.    Nose: No nasal deformity, congestion or rhinorrhea.     Mouth/Throat:     Mouth: Mucous membranes are moist.     Tongue: Tongue does not deviate from midline.     Pharynx: Oropharynx is clear. Uvula midline.     Comments: No tonsillar hypertrophy or exudate Eyes:     General: No scleral icterus.    Conjunctiva/sclera: Conjunctivae normal.     Pupils: Pupils are equal, round, and reactive to light.  Neck:      Comments: Right-sided posterior cervical LAD that is mildly tender, shotty Cardiovascular:     Rate and Rhythm: Normal rate.  Pulmonary:     Effort: Pulmonary effort is normal. No respiratory distress.     Breath sounds: No wheezing.  Musculoskeletal:     Cervical back: Normal range of motion and neck supple. No muscular tenderness.  Lymphadenopathy:     Cervical: Cervical adenopathy present.  Skin:    General: Skin is warm.     Capillary Refill: Capillary refill takes less than 2 seconds.     Coloration: Skin is not jaundiced or pale.  Neurological:     Mental Status: He is alert.      UC Treatments / Results  Labs (all labs ordered are listed, but only abnormal results are displayed) Labs Reviewed - No data to display  EKG   Radiology No results found.  Procedures Procedures (including critical care time)  Medications Ordered in UC Medications - No data to display  Initial Impression / Assessment and Plan / UC Course  I have reviewed the triage vital signs and the nursing notes.  Pertinent labs & imaging results that were available during my care of the patient were reviewed by me and considered in my medical decision making (see chart for details).     Patient afebrile, nontoxic in office today.  H&P consistent with infected sebaceous cyst of posterior right auricle.  Will treat supportively as outlined below, add Augmentin, and have patient follow-up with PCP in 1 week for repeat evaluation.  Return precautions discussed, patient verbalized understanding and is agreeable to plan. Final Clinical Impressions(s) / UC Diagnoses   Final diagnoses:  Infected sebaceous cyst of skin     Discharge Instructions     Keep area(s) clean and dry. Apply hot compress / towel for 5-10 minutes 3-5 times daily. Take antibiotic as prescribed with food - important to complete course. Return for worsening pain, redness, swelling, discharge, fever.  Helpful prevention  tips: Keep nails short to avoid secondary skin infections. Use new, clean razors when shaving. Avoid antiperspirants - look for deodorants without aluminum. Avoid wearing underwire bras as this can irritate the area further.      ED Prescriptions    Medication Sig Dispense Auth. Provider   amoxicillin-clavulanate (AUGMENTIN) 875-125 MG tablet Take 1 tablet by mouth every 12 (twelve) hours.  14 tablet Hall-Potvin, [place], PA-C     PDMP not reviewed this encounter.   Hall-Potvin, [place], [place] 03/16/19 1549

## 2019-03-16 NOTE — ED Triage Notes (Addendum)
Pt presents to [facility] for assessment of right sided headache and ear pain with an abscess behind the right ear x 2 weeks.  Patient states the headache was worse today than normal.  C/o "right eardrum throbbing".

## 2019-05-04 ENCOUNTER — Ambulatory Visit
Admission: EM | Admit: 2019-05-04 | Discharge: 2019-05-04 | Disposition: A | Discharge status: Home / Self Care | Payer: Self-pay

## 2019-05-04 DIAGNOSIS — W540XXA Bitten by dog, initial encounter: Secondary | ICD-10-CM

## 2019-05-04 DIAGNOSIS — Z5189 Encounter for other specified aftercare: Secondary | ICD-10-CM

## 2019-05-04 NOTE — ED Triage Notes (Signed)
Pt states bit by his neighbors dog on Saturday evening. States had a lac to the tip of rt index finger. States super glued and bandaged applied. States the dog is up dated on shots. States just wants a professional to look at it.

## 2019-05-04 NOTE — Discharge Instructions (Signed)
No signs of infection. At this time, clean wound with soap and water daily, and keep it clean and dry.  He can use finger splint during activity.  If noticing signs of infection including swelling, redness, pain, drainage, follow-up for reevaluation.

## 2019-05-04 NOTE — ED Provider Notes (Addendum)
[name]    CSN: [id number] Arrival date & time: 05/04/19  1202      History   Chief Complaint Chief Complaint  Patient presents with  . Animal Bite    HPI Antione A Gracy is a 34 y.o. male.   34 year old male comes in for 3-day history of laceration to right distal index finger sustained by a dog bite.  Bit by neighbor's dog, who is up-to-date on immunizations.  Patient states irrigated wound with water, and was dressed with gauze, but was unable to stop the bleeding.  He eventually used superglue over laceration to stop the bleeding.  Denies any worsening of symptoms.  States still has intermittent bleeding, and wanted to get evaluated.  Denies numbness, tingling, swelling, erythema, warmth.  Denies purulent drainage. Tetanus up to date.      History reviewed. No pertinent past medical history.  There are no problems to display for this patient.   History reviewed. No pertinent surgical history.     Home Medications    Prior to Admission medications   Medication Sig Start Date End Date Taking? Authorizing Provider  acetaminophen (TYLENOL) 325 MG tablet Take 650 mg by mouth every 6 (six) hours as needed for moderate pain.    [provider]  ibuprofen (ADVIL,MOTRIN) 200 MG tablet Take 400 mg by mouth every 6 (six) hours as needed for headache, mild pain or moderate pain.    [provider]  loratadine (CLARITIN) 10 MG tablet Take 10 mg by mouth daily as needed for allergies.    [provider]  metoCLOPramide (REGLAN) 10 MG tablet Take 1 tablet (10 mg total) by mouth every 6 (six) hours as needed for nausea (or headache). Patient not taking: Reported on 06/22/2017 7/89/38 1/0/17  Delora Fuel, MD    Family History Family History  Problem Relation Age of Onset  . Diabetes Other   . Schizophrenia Mother     Social History Social History   Tobacco Use  . Smoking status: Current Some Day Smoker  . Smokeless tobacco: Never  Used  Substance Use Topics  . Alcohol use: Yes    Comment: occ  . Drug use: No     Allergies   Coconut oil, Peanuts [peanut oil], and Sesame seed [sesame oil]   Review of Systems Review of Systems  Reason unable to perform ROS: See HPI as above.     Physical Exam Triage Vital Signs ED Triage Vitals  Enc Vitals Group     BP 05/04/19 1227 114/70     Pulse Rate 05/04/19 1227 65     Resp 05/04/19 1227 18     Temp 05/04/19 1227 98.4 F (36.9 C)     Temp Source 05/04/19 1227 Oral     SpO2 05/04/19 1227 99 %     Weight --      Height --      Head Circumference --      Peak Flow --      Pain Score 05/04/19 1233 4     Pain Loc --      Pain Edu? --      Excl. in [place]? --    No data found.  Updated Vital Signs BP 114/70 (BP Location: Left Arm)   Pulse 65   Temp 98.4 F (36.9 C) (Oral)   Resp 18   SpO2 99%   Physical Exam Constitutional:      General: He is not in acute distress.  Appearance: Normal appearance. He is well-developed. He is not toxic-appearing or diaphoretic.  HENT:     Head: Normocephalic and atraumatic.  Eyes:     Conjunctiva/sclera: Conjunctivae normal.     Pupils: Pupils are equal, round, and reactive to light.  Pulmonary:     Effort: Pulmonary effort is normal. No respiratory distress.     Comments: Speaking in full sentences without difficulty Musculoskeletal:     Cervical back: Normal range of motion and neck supple.     Comments: Laceration to the distal right index finger. This is mostly covered by superglue. Partially exposed laceration is to the distal finger pad, no bleeding, drainage. No erythema, warmth. No tenderness to palpation. However, wrinkled skin throughout right index finger with whitening to the exposed laceration due moisture/dressing. Full ROM of finger. NVI  Skin:    General: Skin is warm and dry.  Neurological:     Mental Status: He is alert and oriented to person, place, and time.     UC Treatments / Results  Labs  (all labs ordered are listed, but only abnormal results are displayed) Labs Reviewed - No data to display  EKG   Radiology No results found.  Procedures Procedures (including critical care time)  Medications Ordered in UC Medications - No data to display  Initial Impression / Assessment and Plan / UC Course  I have reviewed the triage vital signs and the nursing notes.  Pertinent labs & imaging results that were available during my care of the patient were reviewed by me and considered in my medical decision making (see chart for details).    No signs of infection. However, wound is moist from dressing. Discussed to allow air drying and to keep wound clean and dry. Will provide finger splint if needed. Return precautions given. Patient expresses understanding and agrees to plan.  Final Clinical Impressions(s) / UC Diagnoses   Final diagnoses:  Dog bite, initial encounter  Visit for wound check   ED Prescriptions    None     PDMP not reviewed this encounter.   Belinda Fisher, PA-C 05/04/19 1753    Belinda Fisher, PA-C 05/17/19 [phone]

## 2019-07-12 ENCOUNTER — Ambulatory Visit
Admission: EM | Admit: 2019-07-12 | Discharge: 2019-07-12 | Disposition: A | Discharge status: Home / Self Care | Payer: Self-pay | Attending: Physician Assistant | Admitting: Physician Assistant

## 2019-07-12 ENCOUNTER — Encounter: Payer: Self-pay | Admitting: Emergency Medicine

## 2019-07-12 ENCOUNTER — Other Ambulatory Visit: Payer: Self-pay

## 2019-07-12 DIAGNOSIS — L03113 Cellulitis of right upper limb: Secondary | ICD-10-CM

## 2019-07-12 DIAGNOSIS — W503XXA Accidental bite by another person, initial encounter: Secondary | ICD-10-CM

## 2019-07-12 MED ORDER — AMOXICILLIN-POT CLAVULANATE 875-125 MG PO TABS: 1.0000 | ORAL_TABLET | Freq: Two times a day (BID) | ORAL | 0 refills | Status: DC
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
## 2019-07-12 NOTE — Discharge Instructions (Signed)
Start augmentin as directed. Keep area clean and dry. Ibuprofen 800mg  three times a day, can add tylenol 1000mg  three times a day if needed. Warm compress to the area. If having spreading redness, increased warmth, fever despite being on augmentin, go to the ED for further evaluation.
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
## 2019-07-14 ENCOUNTER — Encounter (HOSPITAL_COMMUNITY): Payer: Self-pay | Admitting: Emergency Medicine

## 2019-07-14 ENCOUNTER — Emergency Department (HOSPITAL_COMMUNITY)
Admission: EM | Admit: 2019-07-14 | Discharge: 2019-07-14 | Disposition: A | Discharge status: Home / Self Care | Payer: Self-pay | Attending: Emergency Medicine | Admitting: Emergency Medicine

## 2019-07-14 ENCOUNTER — Other Ambulatory Visit: Payer: Self-pay

## 2019-07-14 DIAGNOSIS — Z5321 Procedure and treatment not carried out due to patient leaving prior to being seen by health care provider: Secondary | ICD-10-CM | POA: Insufficient documentation

## 2019-07-14 DIAGNOSIS — M79621 Pain in right upper arm: Secondary | ICD-10-CM | POA: Insufficient documentation
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
## 2019-07-14 NOTE — ED Notes (Signed)
Pt called from ED lobby times 1

## 2019-07-14 NOTE — ED Notes (Signed)
Per registration, patient turned in labels and reports he is leaving. 

## 2019-09-28 ENCOUNTER — Ambulatory Visit
Admission: EM | Admit: 2019-09-28 | Discharge: 2019-09-28 | Disposition: A | Discharge status: Home / Self Care | Payer: Self-pay | Attending: Emergency Medicine | Admitting: Emergency Medicine

## 2019-09-28 ENCOUNTER — Other Ambulatory Visit: Payer: Self-pay

## 2019-09-28 DIAGNOSIS — K029 Dental caries, unspecified: Secondary | ICD-10-CM

## 2019-09-28 DIAGNOSIS — K0381 Cracked tooth: Secondary | ICD-10-CM

## 2019-09-28 DIAGNOSIS — K0889 Other specified disorders of teeth and supporting structures: Secondary | ICD-10-CM

## 2019-09-28 MED ORDER — AMOXICILLIN-POT CLAVULANATE 875-125 MG PO TABS: 1.0000 | ORAL_TABLET | Freq: Two times a day (BID) | ORAL | 0 refills | Status: DC

## 2019-09-28 MED ORDER — NAPROXEN 500 MG PO TABS: 500.0000 mg | ORAL_TABLET | Freq: Two times a day (BID) | ORAL | 0 refills | Status: DC

## 2019-09-28 NOTE — Discharge Instructions (Addendum)
Low-Cost Community Dental Resources:  [place] [facility] Address: [address], [place], [place], [address] Phone: [phone]  - Dr. Civils Address: [address], , [place], [address] Phone: [phone] 

## 2019-09-28 NOTE — ED Provider Notes (Signed)
[name]    CSN: [id number] Arrival date & time: 09/28/19  1005      History   Chief Complaint Chief Complaint  Patient presents with  . Dental Pain    HPI Tim Ford is a 34 y.o. male   Presenting for left upper dental pain for 1 week.  States he cracked his tooth a long time ago and has had pain in right lower teeth in the past.  Endorsing poor dentition and does not have a dentist.  Denies difficulty breathing, swelling, chest pain, palpitations, fever.  History reviewed. No pertinent past medical history.  There are no problems to display for this patient.   History reviewed. No pertinent surgical history.     Home Medications    Prior to Admission medications   Medication Sig Start Date End Date Taking? Authorizing Provider  acetaminophen (TYLENOL) 325 MG tablet Take 650 mg by mouth every 6 (six) hours as needed for moderate pain.    [provider]  amoxicillin-clavulanate (AUGMENTIN) 875-125 MG tablet Take 1 tablet by mouth every 12 (twelve) hours. 09/28/19   Hall-Potvin, [place], PA-C  ibuprofen (ADVIL,MOTRIN) 200 MG tablet Take 400 mg by mouth every 6 (six) hours as needed for headache, mild pain or moderate pain.    [provider]  loratadine (CLARITIN) 10 MG tablet Take 10 mg by mouth daily as needed for allergies.    [provider]  naproxen (NAPROSYN) 500 MG tablet Take 1 tablet (500 mg total) by mouth 2 (two) times daily. 09/28/19   Hall-Potvin, [place], PA-C  metoCLOPramide (REGLAN) 10 MG tablet Take 1 tablet (10 mg total) by mouth every 6 (six) hours as needed for nausea (or headache). Patient not taking: Reported on 06/22/2017 09/01/16 03/16/19  Dione Booze, MD    Family History Family History  Problem Relation Age of Onset  . Diabetes Other   . Schizophrenia Mother     Social History Social History   Tobacco Use  . Smoking status: Current Some Day Smoker  . Smokeless tobacco: Never Used  Substance  Use Topics  . Alcohol use: Yes    Comment: occ  . Drug use: No     Allergies   Coconut oil, Peanuts [peanut oil], and Sesame seed [sesame oil]   Review of Systems As per HPI   Physical Exam Triage Vital Signs ED Triage Vitals  Enc Vitals Group     BP 09/28/19 1152 108/63     Pulse Rate 09/28/19 1151 (!) 55     Resp 09/28/19 1151 16     Temp 09/28/19 1151 98.2 F (36.8 C)     Temp src --      SpO2 09/28/19 1151 98 %     Weight --      Height --      Head Circumference --      Peak Flow --      Pain Score --      Pain Loc --      Pain Edu? --      Excl. in [place]? --    No data found.  Updated Vital Signs BP 108/63   Pulse (!) 55   Temp 98.2 F (36.8 C)   Resp 16   SpO2 98%   Visual Acuity Right Eye Distance:   Left Eye Distance:   Bilateral Distance:    Right Eye Near:   Left Eye Near:    Bilateral Near:     Physical Exam  Constitutional:      General: He is not in acute distress. HENT:     Head: Normocephalic and atraumatic.     Mouth/Throat:     Mouth: Mucous membranes are moist.     Pharynx: Oropharynx is clear. No posterior oropharyngeal erythema.     Comments: Poor dentition with numerous teeth missing and eroded to gumline.  Upper left front molar with crack, decay, gingival tenderness.  No induration, fluctuance, discharge. Eyes:     General: No scleral icterus.    Pupils: Pupils are equal, round, and reactive to light.  Cardiovascular:     Rate and Rhythm: Normal rate.  Pulmonary:     Effort: Pulmonary effort is normal. No respiratory distress.     Breath sounds: No wheezing.  Skin:    Coloration: Skin is not jaundiced or pale.  Neurological:     Mental Status: He is alert and oriented to person, place, and time.      UC Treatments / Results  Labs (all labs ordered are listed, but only abnormal results are displayed) Labs Reviewed - No data to display  EKG   Radiology No results found.  Procedures Procedures (including  critical care time)  Medications Ordered in UC Medications - No data to display  Initial Impression / Assessment and Plan / UC Course  I have reviewed the triage vital signs and the nursing notes.  Pertinent labs & imaging results that were available during my care of the patient were reviewed by me and considered in my medical decision making (see chart for details).     Afebrile, nontoxic, without airway compromise.  Will provide antibiotic coverage for likely dental infection given poor dentition.  Provide dental resources.  Return precautions discussed, pt verbalized understanding and is agreeable to plan. Final Clinical Impressions(s) / UC Diagnoses   Final diagnoses:  Dental decay  Pain, dental  Cracked tooth     Discharge Instructions     Low-Cost Community Dental Resources:  [facility] Address: [address] [place], [place], [address] Phone: [phone]  - Dr. Lawrence Marseilles Address: [address], [place], [place], [address] Phone: [phone]    ED Prescriptions    Medication Sig Dispense Auth. Provider   amoxicillin-clavulanate (AUGMENTIN) 875-125 MG tablet Take 1 tablet by mouth every 12 (twelve) hours. 14 tablet Hall-Potvin, [place], PA-C   naproxen (NAPROSYN) 500 MG tablet Take 1 tablet (500 mg total) by mouth 2 (two) times daily. 30 tablet Hall-Potvin, [place], PA-C     I have reviewed the PDMP during this encounter.   Hall-Potvin, [place], [place] 09/28/19 1321

## 2019-09-28 NOTE — ED Triage Notes (Signed)
Pt c/o pain and swelling to left side of upper gums x 1 week, has broken tooth

## 2020-02-09 ENCOUNTER — Ambulatory Visit
Admission: EM | Admit: 2020-02-09 | Discharge: 2020-02-09 | Disposition: A | Discharge status: Home / Self Care | Payer: BC Managed Care – PPO | Attending: Internal Medicine | Admitting: Internal Medicine

## 2020-02-09 ENCOUNTER — Other Ambulatory Visit: Payer: Self-pay

## 2020-02-09 ENCOUNTER — Encounter: Payer: Self-pay | Admitting: Emergency Medicine

## 2020-02-09 DIAGNOSIS — K2900 Acute gastritis without bleeding: Secondary | ICD-10-CM | POA: Diagnosis not present

## 2020-02-09 MED ORDER — PANTOPRAZOLE SODIUM 20 MG PO TBEC: 20.0000 mg | DELAYED_RELEASE_TABLET | Freq: Every day | ORAL | 0 refills | Status: DC

## 2020-02-09 MED ORDER — LACTINEX PO CHEW: 1.0000 | CHEWABLE_TABLET | Freq: Three times a day (TID) | ORAL | Status: AC

## 2020-02-09 MED ORDER — ONDANSETRON 4 MG PO TBDP: 4.0000 mg | ORAL_TABLET | Freq: Three times a day (TID) | ORAL | 0 refills | Status: DC | PRN

## 2020-02-09 NOTE — ED Provider Notes (Signed)
[name]    CSN: [id number] Arrival date & time: 02/09/20  1552      History   Chief Complaint Chief Complaint  Patient presents with  . Vomiting    HPI Tim Ford is a 35 y.o. male to urgent care with 2-day history of nausea, vomiting and a couple of loose stools since Sunday.  Patient says that he developed symptoms 2 days ago and is currently experiencing epigastric pain.  Pain is burning in nature.  He has had a couple of nonbilious nonbloody vomitus.  No abdominal distention.  He is also eating fast food and suspect that he may have had food poisoning from there.  No body aches, fever or chills.  No dizziness, near syncope or syncopal episodes.  No known relieving factors.  Oral intake is preserved.Marland Kitchen   HPI  History reviewed. No pertinent past medical history.  There are no problems to display for this patient.   History reviewed. No pertinent surgical history.     Home Medications    Prior to Admission medications   Medication Sig Start Date End Date Taking? Authorizing Provider  lactobacillus acidophilus & bulgar (LACTINEX) chewable tablet Chew 1 tablet by mouth 3 (three) times daily with meals for 10 days. 02/09/20 02/19/20 Yes Ludy Messamore, Britta Mccreedy, MD  ondansetron (ZOFRAN ODT) 4 MG disintegrating tablet Take 1 tablet (4 mg total) by mouth every 8 (eight) hours as needed for nausea or vomiting. 02/09/20  Yes Ger Nicks, Britta Mccreedy, MD  pantoprazole (PROTONIX) 20 MG tablet Take 1 tablet (20 mg total) by mouth daily. 02/09/20  Yes Sailor Hevia, Britta Mccreedy, MD  acetaminophen (TYLENOL) 325 MG tablet Take 650 mg by mouth every 6 (six) hours as needed for moderate pain.    [provider]  ibuprofen (ADVIL,MOTRIN) 200 MG tablet Take 400 mg by mouth every 6 (six) hours as needed for headache, mild pain or moderate pain.    [provider]  loratadine (CLARITIN) 10 MG tablet Take 10 mg by mouth daily as needed for allergies.    [provider]  naproxen  (NAPROSYN) 500 MG tablet Take 1 tablet (500 mg total) by mouth 2 (two) times daily. 09/28/19   Hall-Potvin, [place], PA-C  metoCLOPramide (REGLAN) 10 MG tablet Take 1 tablet (10 mg total) by mouth every 6 (six) hours as needed for nausea (or headache). Patient not taking: Reported on 06/22/2017 09/01/16 03/16/19  Dione Booze, MD    Family History Family History  Problem Relation Age of Onset  . Diabetes Other   . Schizophrenia Mother     Social History Social History   Tobacco Use  . Smoking status: Current Some Day Smoker  . Smokeless tobacco: Never Used  Substance Use Topics  . Alcohol use: Yes    Comment: occ  . Drug use: No     Allergies   Coconut oil, Peanuts [peanut oil], and Sesame seed [sesame oil]   Review of Systems Review of Systems  Constitutional: Negative.   Respiratory: Negative for cough.   Cardiovascular: Negative for chest pain.  Gastrointestinal: Positive for abdominal pain, nausea and vomiting. Negative for blood in stool, constipation and diarrhea.     Physical Exam Triage Vital Signs ED Triage Vitals  Enc Vitals Group     BP 02/09/20 1800 126/84     Pulse Rate 02/09/20 1800 69     Resp 02/09/20 1800 16     Temp 02/09/20 1800 97.7 F (36.5 C)     Temp  Source 02/09/20 1800 Oral     SpO2 02/09/20 1800 98 %     Weight --      Height --      Head Circumference --      Peak Flow --      Pain Score 02/09/20 1810 3     Pain Loc --      Pain Edu? --      Excl. in [place]? --    No data found.  Updated Vital Signs BP 126/84   Pulse 69   Temp 97.7 F (36.5 C) (Oral)   Resp 16   SpO2 98%   Visual Acuity Right Eye Distance:   Left Eye Distance:   Bilateral Distance:    Right Eye Near:   Left Eye Near:    Bilateral Near:     Physical Exam Vitals and nursing note reviewed.  Cardiovascular:     Rate and Rhythm: Normal rate and regular rhythm.     Pulses: Normal pulses.     Heart sounds: Normal heart sounds.  Pulmonary:     Effort:  Pulmonary effort is normal.     Breath sounds: Normal breath sounds.  Abdominal:     Comments: Epigastric discomfort.  Bowel sounds normal.  No masses palpated.  No hepatosplenomegaly.      UC Treatments / Results  Labs (all labs ordered are listed, but only abnormal results are displayed) Labs Reviewed - No data to display  EKG   Radiology No results found.  Procedures Procedures (including critical care time)  Medications Ordered in UC Medications - No data to display  Initial Impression / Assessment and Plan / UC Course  I have reviewed the triage vital signs and the nursing notes.  Pertinent labs & imaging results that were available during my care of the patient were reviewed by me and considered in my medical decision making (see chart for details).     1.  Acute superficial gastritis without hemorrhage: Protonix 20 mg orally daily Zofran as needed for nausea Increase oral fluid intake Continue with regular diet. Patient is advised to take some probiotics to help with loose stools or diarrhea. Return precautions given. Final Clinical Impressions(s) / UC Diagnoses   Final diagnoses:  Acute superficial gastritis without hemorrhage     Discharge Instructions     Please get some probiotics over-the-counter Increase oral fluid intake so you stay well-hydrated Take medications as prescribed If you have worsening abdominal pain please return to the urgent care to be reevaluated.   ED Prescriptions    Medication Sig Dispense Auth. Provider   pantoprazole (PROTONIX) 20 MG tablet Take 1 tablet (20 mg total) by mouth daily. 14 tablet Keivon Garden, Britta Mccreedy, MD   lactobacillus acidophilus & bulgar (LACTINEX) chewable tablet Chew 1 tablet by mouth 3 (three) times daily with meals for 10 days.  Merrilee Jansky, MD   ondansetron (ZOFRAN ODT) 4 MG disintegrating tablet Take 1 tablet (4 mg total) by mouth every 8 (eight) hours as needed for nausea or vomiting. 20 tablet  Cloy Cozzens, Britta Mccreedy, MD     PDMP not reviewed this encounter.   Merrilee Jansky, MD 02/09/20 [phone]

## 2020-02-09 NOTE — ED Triage Notes (Signed)
Pt sts N/V/D since Sunday

## 2020-02-09 NOTE — Discharge Instructions (Signed)
Please get some probiotics over-the-counter Increase oral fluid intake so you stay well-hydrated Take medications as prescribed If you have worsening abdominal pain please return to the urgent care to be reevaluated.

## 2020-07-27 ENCOUNTER — Other Ambulatory Visit: Payer: Self-pay

## 2020-07-27 ENCOUNTER — Ambulatory Visit
Admission: EM | Admit: 2020-07-27 | Discharge: 2020-07-27 | Disposition: A | Discharge status: Home / Self Care | Payer: BC Managed Care – PPO | Attending: Student | Admitting: Student

## 2020-07-27 ENCOUNTER — Ambulatory Visit
Admission: EM | Admit: 2020-07-27 | Discharge: 2020-07-27 | Discharge status: Against Medical Advice | Payer: BC Managed Care – PPO

## 2020-07-27 DIAGNOSIS — K047 Periapical abscess without sinus: Secondary | ICD-10-CM | POA: Diagnosis not present

## 2020-07-27 DIAGNOSIS — K0889 Other specified disorders of teeth and supporting structures: Secondary | ICD-10-CM

## 2020-07-27 MED ORDER — AMOXICILLIN-POT CLAVULANATE 875-125 MG PO TABS: 1.0000 | ORAL_TABLET | Freq: Two times a day (BID) | ORAL | 0 refills | Status: DC

## 2020-07-27 MED ORDER — LIDOCAINE VISCOUS HCL 2 % MT SOLN: 15.0000 mL | OROMUCOSAL | 0 refills | Status: DC | PRN

## 2020-07-27 NOTE — ED Triage Notes (Signed)
Pt present left side toothache with swelling and trouble chewing. Symptoms started two days  ago.

## 2020-07-27 NOTE — Discharge Instructions (Addendum)
-  Start the antibiotic-Augmentin (amoxicillin-clavulanate), 1 pill every 12 hours for 7 days.  You can take this with food like with breakfast and dinner. -For sore throat, use lidocaine mouthwash up to every 4 hours. Make sure not to eat for at least 1 hour after using this, as your mouth will be very numb and you could bite yourself. -For additional relief-. Take Tylenol 1000 mg 3 times daily, and ibuprofen 800 mg 3 times daily with food.  You can take these together, or alternate every 3-4 hours. -Follow-up with dentist, information later in this paperwork.  -Seek additional medical attention if you develop new symptoms despite treatment, like pain under your tongue, pain of the roof of your mouth, trouble swallowing, shortness of breath, dizziness

## 2020-07-27 NOTE — ED Notes (Signed)
No answer in waiting area. Called pt with no answer, voice massage left.

## 2020-07-27 NOTE — ED Provider Notes (Signed)
[name]    CSN: [id number] Arrival date & time: 07/27/20  1315      History   Chief Complaint Chief Complaint  Patient presents with   Dental Pain    HPI Tim Ford is a 35 y.o. male presenting with dental pain lower L wisdom tooth.  Medical history infections of this tooth before.  States that he needs to get his wisdom teeth out.  Endorses 2 days of pain and swelling surrounding the left lower wisdom tooth.  Denies foul taste in mouth, trouble swallowing, pain of the floor or roof of mouth, fever/chills.  HPI  History reviewed. No pertinent past medical history.  There are no problems to display for this patient.   History reviewed. No pertinent surgical history.     Home Medications    Prior to Admission medications   Medication Sig Start Date End Date Taking? Authorizing Provider  amoxicillin-clavulanate (AUGMENTIN) 875-125 MG tablet Take 1 tablet by mouth every 12 (twelve) hours. 07/27/20  Yes Rhys Martini, PA-C  lidocaine (XYLOCAINE) 2 % solution Use as directed 15 mLs in the mouth or throat as needed for mouth pain. 07/27/20  Yes Rhys Martini, PA-C  acetaminophen (TYLENOL) 325 MG tablet Take 650 mg by mouth every 6 (six) hours as needed for moderate pain.    [provider]  ibuprofen (ADVIL,MOTRIN) 200 MG tablet Take 400 mg by mouth every 6 (six) hours as needed for headache, mild pain or moderate pain.    [provider]  loratadine (CLARITIN) 10 MG tablet Take 10 mg by mouth daily as needed for allergies.    [provider]  naproxen (NAPROSYN) 500 MG tablet Take 1 tablet (500 mg total) by mouth 2 (two) times daily. 09/28/19   Hall-Potvin, [place], PA-C  ondansetron (ZOFRAN ODT) 4 MG disintegrating tablet Take 1 tablet (4 mg total) by mouth every 8 (eight) hours as needed for nausea or vomiting. 02/09/20   Merrilee Jansky, MD  pantoprazole (PROTONIX) 20 MG tablet Take 1 tablet (20 mg total) by mouth daily. 02/09/20    Lamptey, Britta Mccreedy, MD  metoCLOPramide (REGLAN) 10 MG tablet Take 1 tablet (10 mg total) by mouth every 6 (six) hours as needed for nausea (or headache). Patient not taking: Reported on 06/22/2017 09/01/16 03/16/19  Dione Booze, MD    Family History Family History  Problem Relation Age of Onset   Diabetes Other    Schizophrenia Mother     Social History Social History   Tobacco Use   Smoking status: Some Days    Pack years: 0.00   Smokeless tobacco: Never  Substance Use Topics   Alcohol use: Yes    Comment: occ   Drug use: No     Allergies   Coconut oil, Peanuts [peanut oil], and Sesame seed [sesame oil]   Review of Systems Review of Systems  HENT:  Positive for dental problem.   All other systems reviewed and are negative.   Physical Exam Triage Vital Signs ED Triage Vitals  Enc Vitals Group     BP 07/27/20 1400 116/74     Pulse Rate 07/27/20 1400 (!) 54     Resp 07/27/20 1400 18     Temp 07/27/20 1400 97.8 F (36.6 C)     Temp Source 07/27/20 1400 Oral     SpO2 07/27/20 1400 98 %     Weight --      Height --      Head  Circumference --      Peak Flow --      Pain Score 07/27/20 1401 3     Pain Loc --      Pain Edu? --      Excl. in GC? --    No data found.  Updated Vital Signs BP 116/74 (BP Location: Left Arm)   Pulse (!) 54   Temp 97.8 F (36.6 C) (Oral)   Resp 18   SpO2 98%   Visual Acuity Right Eye Distance:   Left Eye Distance:   Bilateral Distance:    Right Eye Near:   Left Eye Near:    Bilateral Near:     Physical Exam Vitals reviewed.  Constitutional:      General: He is not in acute distress.    Appearance: Normal appearance. He is not ill-appearing, toxic-appearing or diaphoretic.  HENT:     Head: Normocephalic and atraumatic.     Jaw: There is normal jaw occlusion. No trismus, tenderness, swelling, pain on movement or malocclusion.     Salivary Glands: Right salivary gland is not diffusely enlarged or tender. Left salivary  gland is not diffusely enlarged or tender.     Right Ear: Hearing normal.     Left Ear: Hearing normal.     Nose: Nose normal.     Mouth/Throat:     Lips: Pink.     Mouth: Mucous membranes are moist. No lacerations or oral lesions.     Dentition: Abnormal dentition. Does not have dentures. Dental tenderness and dental caries present.     Tongue: No lesions. Tongue does not deviate from midline.     Palate: No mass.     Pharynx: Oropharynx is clear. Uvula midline. No oropharyngeal exudate or posterior oropharyngeal erythema.     Tonsils: No tonsillar exudate or tonsillar abscesses.     Comments: Poor dentician  Gingival swelling surrounding posterior L molars No trismus, drooling, sore throat, voice changes, swelling underneath the tongue, swelling underneath the jaw, neck stiffness.  Eyes:     Extraocular Movements: Extraocular movements intact.     Pupils: Pupils are equal, round, and reactive to light.  Pulmonary:     Effort: Pulmonary effort is normal.  Lymphadenopathy:     Cervical: Cervical adenopathy present.     Left cervical: Superficial cervical adenopathy present.  Neurological:     General: No focal deficit present.     Mental Status: He is alert and oriented to person, place, and time.  Psychiatric:        Mood and Affect: Mood normal.        Behavior: Behavior normal.        Thought Content: Thought content normal.        Judgment: Judgment normal.     UC Treatments / Results  Labs (all labs ordered are listed, but only abnormal results are displayed) Labs Reviewed - No data to display  EKG   Radiology No results found.  Procedures Procedures (including critical care time)  Medications Ordered in UC Medications - No data to display  Initial Impression / Assessment and Plan / UC Course  I have reviewed the triage vital signs and the nursing notes.  Pertinent labs & imaging results that were available during my care of the patient were reviewed by me  and considered in my medical decision making (see chart for details).     This patient is a very pleasant 35 y.o. year old male presenting with dental infection.  Afebrile, nontachycardic.   Augmentin, viscious lidocaine as below.  Dental resource guide provided.   ED return precautions discussed. Patient verbalizes understanding and agreement.   Final Clinical Impressions(s) / UC Diagnoses   Final diagnoses:  Dental infection     Discharge Instructions      -Start the antibiotic-Augmentin (amoxicillin-clavulanate), 1 pill every 12 hours for 7 days.  You can take this with food like with breakfast and dinner. -For sore throat, use lidocaine mouthwash up to every 4 hours. Make sure not to eat for at least 1 hour after using this, as your mouth will be very numb and you could bite yourself. -For additional relief-. Take Tylenol 1000 mg 3 times daily, and ibuprofen 800 mg 3 times daily with food.  You can take these together, or alternate every 3-4 hours. -Follow-up with dentist, information later in this paperwork.  -Seek additional medical attention if you develop new symptoms despite treatment, like pain under your tongue, pain of the roof of your mouth, trouble swallowing, shortness of breath, dizziness   ED Prescriptions     Medication Sig Dispense Auth. Provider   amoxicillin-clavulanate (AUGMENTIN) 875-125 MG tablet Take 1 tablet by mouth every 12 (twelve) hours. 14 tablet Ignacia Bayley E, PA-C   lidocaine (XYLOCAINE) 2 % solution Use as directed 15 mLs in the mouth or throat as needed for mouth pain. 100 mL Rhys Martini, PA-C      PDMP not reviewed this encounter.   Rhys Martini, PA-C 07/27/20 1431

## 2021-01-16 ENCOUNTER — Other Ambulatory Visit: Payer: Self-pay

## 2021-01-16 ENCOUNTER — Ambulatory Visit
Admission: EM | Admit: 2021-01-16 | Discharge: 2021-01-16 | Disposition: A | Discharge status: Home / Self Care | Payer: BC Managed Care – PPO | Attending: Internal Medicine | Admitting: Internal Medicine

## 2021-01-16 DIAGNOSIS — B349 Viral infection, unspecified: Secondary | ICD-10-CM | POA: Insufficient documentation

## 2021-01-16 DIAGNOSIS — J029 Acute pharyngitis, unspecified: Secondary | ICD-10-CM | POA: Insufficient documentation

## 2021-01-16 LAB — POCT RAPID STREP A (OFFICE): Rapid Strep A Screen: NEGATIVE

## 2021-01-16 MED ORDER — BENZONATATE 100 MG PO CAPS: 100.0000 mg | ORAL_CAPSULE | Freq: Three times a day (TID) | ORAL | 0 refills | Status: DC | PRN

## 2021-01-16 NOTE — ED Provider Notes (Signed)
[name]    CSN: [id number] Arrival date & time: 01/16/21  1455      History   Chief Complaint Chief Complaint  Patient presents with   Cough    HPI Tim Ford is a 35 y.o. male.   Patient presents with nonproductive cough, chills, sore throat, nasal congestion, headache, diarrhea, nausea with 1 episode of vomiting that started yesterday.  Denies any known sick contacts.  Denies any known fevers.  Denies chest pain, shortness of breath, ear pain.  Patient has not yet taken any medications to alleviate symptoms.   Cough  History reviewed. No pertinent past medical history.  There are no problems to display for this patient.   History reviewed. No pertinent surgical history.     Home Medications    Prior to Admission medications   Medication Sig Start Date End Date Taking? Authorizing Provider  benzonatate (TESSALON) 100 MG capsule Take 1 capsule (100 mg total) by mouth every 8 (eight) hours as needed for cough. 01/16/21  Yes Daymond Cordts, Rolly Salter E, FNP  acetaminophen (TYLENOL) 325 MG tablet Take 650 mg by mouth every 6 (six) hours as needed for moderate pain.    [provider]  ibuprofen (ADVIL,MOTRIN) 200 MG tablet Take 400 mg by mouth every 6 (six) hours as needed for headache, mild pain or moderate pain.    [provider]  loratadine (CLARITIN) 10 MG tablet Take 10 mg by mouth daily as needed for allergies.    [provider]  metoCLOPramide (REGLAN) 10 MG tablet Take 1 tablet (10 mg total) by mouth every 6 (six) hours as needed for nausea (or headache). Patient not taking: Reported on 06/22/2017 09/01/16 03/16/19  Dione Booze, MD    Family History Family History  Problem Relation Age of Onset   Diabetes Other    Schizophrenia Mother     Social History Social History   Tobacco Use   Smoking status: Some Days   Smokeless tobacco: Never  Substance Use Topics   Alcohol use: Yes    Comment: occ   Drug use: No      Allergies   Coconut oil, Peanuts [peanut oil], and Sesame seed [sesame oil]   Review of Systems Review of Systems Per HPI  Physical Exam Triage Vital Signs ED Triage Vitals  Enc Vitals Group     BP 01/16/21 1636 116/77     Pulse Rate 01/16/21 1635 (!) 48     Resp 01/16/21 1635 18     Temp 01/16/21 1635 98.3 F (36.8 C)     Temp Source 01/16/21 1635 Oral     SpO2 01/16/21 1635 98 %     Weight --      Height --      Head Circumference --      Peak Flow --      Pain Score 01/16/21 1636 0     Pain Loc --      Pain Edu? --      Excl. in GC? --    No data found.  Updated Vital Signs BP 116/77 (BP Location: Left Arm)    Pulse 67    Temp 98.3 F (36.8 C) (Oral)    Resp 18    SpO2 98%   Visual Acuity Right Eye Distance:   Left Eye Distance:   Bilateral Distance:    Right Eye Near:   Left Eye Near:    Bilateral Near:     Physical Exam Constitutional:  General: He is not in acute distress.    Appearance: Normal appearance. He is not toxic-appearing or diaphoretic.  HENT:     Head: Normocephalic and atraumatic.     Right Ear: Tympanic membrane and ear canal normal.     Left Ear: Tympanic membrane and ear canal normal.     Nose: Congestion present.     Mouth/Throat:     Mouth: Mucous membranes are moist.     Pharynx: Posterior oropharyngeal erythema present.  Eyes:     Extraocular Movements: Extraocular movements intact.     Conjunctiva/sclera: Conjunctivae normal.     Pupils: Pupils are equal, round, and reactive to light.  Cardiovascular:     Rate and Rhythm: Normal rate and regular rhythm.     Pulses: Normal pulses.     Heart sounds: Normal heart sounds.  Pulmonary:     Effort: Pulmonary effort is normal. No respiratory distress.     Breath sounds: Normal breath sounds. No stridor. No wheezing, rhonchi or rales.  Abdominal:     General: Abdomen is flat. Bowel sounds are normal. There is no distension.     Palpations: Abdomen is soft.      Tenderness: There is no abdominal tenderness.  Musculoskeletal:        General: Normal range of motion.     Cervical back: Normal range of motion.  Skin:    General: Skin is warm and dry.  Neurological:     General: No focal deficit present.     Mental Status: He is alert and oriented to person, place, and time. Mental status is at baseline.  Psychiatric:        Mood and Affect: Mood normal.        Behavior: Behavior normal.     UC Treatments / Results  Labs (all labs ordered are listed, but only abnormal results are displayed) Labs Reviewed  CULTURE, GROUP A STREP ([facility])  COVID-19, FLU A+B NAA  POCT RAPID STREP A (OFFICE)    EKG   Radiology No results found.  Procedures Procedures (including critical care time)  Medications Ordered in UC Medications - No data to display  Initial Impression / Assessment and Plan / UC Course  I have reviewed the triage vital signs and the nursing notes.  Pertinent labs & imaging results that were available during my care of the patient were reviewed by me and considered in my medical decision making (see chart for details).     Patient presents with symptoms likely from a viral upper respiratory infection. Differential includes bacterial pneumonia, sinusitis, allergic rhinitis, Covid 19, flu. Do not suspect underlying cardiopulmonary process. Symptoms seem unlikely related to ACS, CHF or COPD exacerbations, pneumonia, pneumothorax. Patient is nontoxic appearing and not in need of emergent medical intervention.  Rapid strep was negative.  Throat culture, COVID-19, flu test pending.  Recommended symptom control with over the counter medications: Daily oral anti-histamine, Oral decongestant or IN corticosteroid, saline irrigations, cepacol lozenges, Robitussin, Delsym, honey tea. Patient to increase clear oral fluid intake.  Return if symptoms fail to improve in 1-2 weeks or you develop shortness of breath, chest pain, severe headache.  Patient states understanding and is agreeable.  Discharged with PCP followup.  Final Clinical Impressions(s) / UC Diagnoses   Final diagnoses:  Viral illness  Sore throat     Discharge Instructions      It appears that you have a viral upper respiratory infection that  is causing your symptoms.  Rapid strep  was negative.  Throat culture, COVID-19, flu test is pending.  We will call you if it is positive.  You have been prescribed a medication to help with your cough.     ED Prescriptions     Medication Sig Dispense Auth. Provider   benzonatate (TESSALON) 100 MG capsule Take 1 capsule (100 mg total) by mouth every 8 (eight) hours as needed for cough. 21 capsule Hind[place], Acie Fredrickson, [place]      PDMP not reviewed this encounter.   Gustavus Bryant, [place] 01/16/21 1714

## 2021-01-16 NOTE — ED Triage Notes (Signed)
Pt c/o cough, chills, nasal congestion, and headache since yesterday. States vomit yesterday and diarrhea today.

## 2021-01-16 NOTE — Discharge Instructions (Signed)
It appears that you have a viral upper respiratory infection that  is causing your symptoms.  Rapid strep was negative.  Throat culture, COVID-19, flu test is pending.  We will call you if it is positive.  You have been prescribed a medication to help with your cough.

## 2021-01-16 NOTE — ED Notes (Signed)
No answer in waiting, no voice mail available

## 2021-01-17 LAB — COVID-19, FLU A+B NAA
Influenza A, NAA: NOT DETECTED
Influenza B, NAA: NOT DETECTED
SARS-CoV-2, NAA: DETECTED — AB

## 2021-01-19 LAB — CULTURE, GROUP A STREP (THRC)

## 2021-04-25 ENCOUNTER — Other Ambulatory Visit: Payer: Self-pay

## 2021-04-25 ENCOUNTER — Ambulatory Visit
Admission: EM | Admit: 2021-04-25 | Discharge: 2021-04-25 | Disposition: A | Discharge status: Home / Self Care | Payer: PRIVATE HEALTH INSURANCE | Attending: Physician Assistant | Admitting: Physician Assistant

## 2021-04-25 DIAGNOSIS — L309 Dermatitis, unspecified: Secondary | ICD-10-CM

## 2021-04-25 MED ORDER — TRIAMCINOLONE ACETONIDE 0.1 % EX CREA: 1.0000 "application " | TOPICAL_CREAM | Freq: Two times a day (BID) | CUTANEOUS | 0 refills | Status: AC

## 2021-04-25 NOTE — ED Triage Notes (Signed)
Pt c/o bilateral hand skin drying/peeling off/rash. States it is starting to hurt. May be r/t washing hands frequently. Onset ~ 2 weeks ago.  ?

## 2021-04-25 NOTE — ED Provider Notes (Signed)
?[name] ? ? ? ?CSN: [id number] ?Arrival date & time: 04/25/21  1133 ? ? ?  ? ?History   ?Chief Complaint ?Chief Complaint  ?Patient presents with  ? hands are dry  ? ? ?HPI ?Tim Ford is a 36 y.o. male.  ? ?Patient here today for evaluation of dry hands that has been ongoing for the last several weeks. He has been using vaseline and cocoa butter without significant relief. He does report working recently around an odd type of rust and is not sure if his hand condition is related as his boots seemed to shrink after exposure.  ? ?The history is provided by the patient.  ? ?History reviewed. No pertinent past medical history. ? ?There are no problems to display for this patient. ? ? ?History reviewed. No pertinent surgical history. ? ? ? ? ?Home Medications   ? ?Prior to Admission medications   ?Medication Sig Start Date End Date Taking? Authorizing Provider  ?triamcinolone cream (KENALOG) 0.1 % Apply 1 application. topically 2 (two) times daily. 04/25/21  Yes Tomi Bamberger, PA-C  ?acetaminophen (TYLENOL) 325 MG tablet Take 650 mg by mouth every 6 (six) hours as needed for moderate pain.    [provider]  ?benzonatate (TESSALON) 100 MG capsule Take 1 capsule (100 mg total) by mouth every 8 (eight) hours as needed for cough. 01/16/21   Gustavus Bryant, FNP  ?ibuprofen (ADVIL,MOTRIN) 200 MG tablet Take 400 mg by mouth every 6 (six) hours as needed for headache, mild pain or moderate pain.    [provider]  ?loratadine (CLARITIN) 10 MG tablet Take 10 mg by mouth daily as needed for allergies.    [provider]  ?metoCLOPramide (REGLAN) 10 MG tablet Take 1 tablet (10 mg total) by mouth every 6 (six) hours as needed for nausea (or headache). ?Patient not taking: Reported on 06/22/2017 09/01/16 03/16/19  Dione Booze, MD  ? ? ?Family History ?Family History  ?Problem Relation Age of Onset  ? Diabetes Other   ? Schizophrenia Mother   ? ? ?Social History ?Social History   ? ?Tobacco Use  ? Smoking status: Some Days  ? Smokeless tobacco: Never  ?Substance Use Topics  ? Alcohol use: Yes  ?  Comment: occ  ? Drug use: No  ? ? ? ?Allergies   ?Coconut oil, Peanuts [peanut oil], and Sesame seed [sesame oil] ? ? ?Review of Systems ?Review of Systems  ?Constitutional:  Negative for chills and fever.  ?Eyes:  Negative for discharge and redness.  ?Skin:  Negative for color change, rash and wound.  ? ? ?Physical Exam ?Triage Vital Signs ?ED Triage Vitals [04/25/21 1148]  ?Enc Vitals Group  ?   BP 117/69  ?   Pulse Rate 69  ?   Resp 18  ?   Temp 98 ?F (36.7 ?C)  ?   Temp Source Oral  ?   SpO2 98 %  ?   Weight   ?   Height   ?   Head Circumference   ?   Peak Flow   ?   Pain Score 0  ?   Pain Loc   ?   Pain Edu?   ?   Excl. in GC?   ? ?No data found. ? ?Updated Vital Signs ?BP 117/69 (BP Location: Left Arm)   Pulse 69   Temp 98 ?F (36.7 ?C) (Oral)   Resp 18   SpO2 98%  ?   ? ?  Physical Exam ?Vitals and nursing note reviewed.  ?Constitutional:   ?   General: He is not in acute distress. ?   Appearance: Normal appearance. He is not ill-appearing.  ?HENT:  ?   Head: Normocephalic and atraumatic.  ?Eyes:  ?   Conjunctiva/sclera: Conjunctivae normal.  ?Cardiovascular:  ?   Rate and Rhythm: Normal rate.  ?Pulmonary:  ?   Effort: Pulmonary effort is normal.  ?Skin: ?   Comments: Dry peeling skin noted to multiple areas of bilateral hands, no active bleeding or drainage  ?Neurological:  ?   Mental Status: He is alert.  ?Psychiatric:     ?   Mood and Affect: Mood normal.     ?   Behavior: Behavior normal.     ?   Thought Content: Thought content normal.  ? ? ? ?UC Treatments / Results  ?Labs ?(all labs ordered are listed, but only abnormal results are displayed) ?Labs Reviewed - No data to display ? ?EKG ? ? ?Radiology ?No results found. ? ?Procedures ?Procedures (including critical care time) ? ?Medications Ordered in UC ?Medications - No data to display ? ?Initial Impression / Assessment and Plan / UC  Course  ?I have reviewed the triage vital signs and the nursing notes. ? ?Pertinent labs & imaging results that were available during my care of the patient were reviewed by me and considered in my medical decision making (see chart for details). ? ?  ?Will trial low dose topical steroids for treatment for 1-2 weeks. Encouraged he continue to use Vaseline and moisturizer as well. Encouraged follow up with any further concerns.  ? ?Final Clinical Impressions(s) / UC Diagnoses  ? ?Final diagnoses:  ?Dermatitis  ? ?Discharge Instructions   ?None ?  ? ?ED Prescriptions   ? ? Medication Sig Dispense Auth. Provider  ? triamcinolone cream (KENALOG) 0.1 % Apply 1 application. topically 2 (two) times daily. 30 g Tomi Bamberger, PA-C  ? ?  ? ?PDMP not reviewed this encounter. ?  ?Tomi Bamberger, PA-C ?04/25/21 1321 ? ?

## 2021-04-30 ENCOUNTER — Ambulatory Visit
Admission: EM | Admit: 2021-04-30 | Discharge: 2021-04-30 | Disposition: A | Discharge status: Home / Self Care | Payer: PRIVATE HEALTH INSURANCE | Attending: Physician Assistant | Admitting: Physician Assistant

## 2021-04-30 ENCOUNTER — Other Ambulatory Visit: Payer: Self-pay

## 2021-04-30 ENCOUNTER — Encounter: Payer: Self-pay | Admitting: Emergency Medicine

## 2021-04-30 DIAGNOSIS — L309 Dermatitis, unspecified: Secondary | ICD-10-CM | POA: Diagnosis not present

## 2021-04-30 MED ORDER — PREDNISONE 20 MG PO TABS: 40.0000 mg | ORAL_TABLET | Freq: Every day | ORAL | 0 refills | Status: AC

## 2021-04-30 NOTE — ED Triage Notes (Signed)
Continued hand rash from work after wearing gloves ?

## 2021-04-30 NOTE — ED Provider Notes (Signed)
?[facility] ? ? ? ?CSN: [id number] ?Arrival date & time: 04/30/21  1418 ? ? ?  ? ?History   ?Chief Complaint ?Chief Complaint  ?Patient presents with  ? Rash  ? ? ?HPI ?Tim Ford is a 36 y.o. male.  ? ?Patient here today for evaluation of rash present to hands.  He reports that symptoms were improving until he had to wear gloves at work recently and rash returned. He would like work note if possible to allow him to go gloveless at work.  ? ?The history is provided by the patient.  ? ?History reviewed. No pertinent past medical history. ? ?There are no problems to display for this patient. ? ? ?History reviewed. No pertinent surgical history. ? ? ? ? ?Home Medications   ? ?Prior to Admission medications   ?Medication Sig Start Date End Date Taking? Authorizing Provider  ?predniSONE (DELTASONE) 20 MG tablet Take 2 tablets (40 mg total) by mouth daily with breakfast for 5 days. 04/30/21 05/05/21 Yes Francene Finders, PA-C  ?acetaminophen (TYLENOL) 325 MG tablet Take 650 mg by mouth every 6 (six) hours as needed for moderate pain.    [provider]  ?benzonatate (TESSALON) 100 MG capsule Take 1 capsule (100 mg total) by mouth every 8 (eight) hours as needed for cough. 01/16/21   Teodora Medici, FNP  ?ibuprofen (ADVIL,MOTRIN) 200 MG tablet Take 400 mg by mouth every 6 (six) hours as needed for headache, mild pain or moderate pain.    [provider]  ?loratadine (CLARITIN) 10 MG tablet Take 10 mg by mouth daily as needed for allergies.    [provider]  ?triamcinolone cream (KENALOG) 0.1 % Apply 1 application. topically 2 (two) times daily. 04/25/21   Francene Finders, PA-C  ?metoCLOPramide (REGLAN) 10 MG tablet Take 1 tablet (10 mg total) by mouth every 6 (six) hours as needed for nausea (or headache). ?Patient not taking: Reported on 06/22/2017 [id number]  Delora Fuel, MD  ? ? ?Family History ?Family History  ?Problem Relation Age of Onset  ? Diabetes Other   ?  Schizophrenia Mother   ? ? ?Social History ?Social History  ? ?Tobacco Use  ? Smoking status: Some Days  ? Smokeless tobacco: Never  ?Substance Use Topics  ? Alcohol use: Yes  ?  Comment: occ  ? Drug use: No  ? ? ? ?Allergies   ?Coconut oil, Peanuts [peanut oil], and Sesame seed [sesame oil] ? ? ?Review of Systems ?Review of Systems  ?Constitutional:  Negative for chills and fever.  ?Eyes:  Negative for discharge and redness.  ?Skin:  Positive for rash.  ? ? ?Physical Exam ?Triage Vital Signs ?ED Triage Vitals  ?Enc Vitals Group  ?   BP   ?   Pulse   ?   Resp   ?   Temp   ?   Temp src   ?   SpO2   ?   Weight   ?   Height   ?   Head Circumference   ?   Peak Flow   ?   Pain Score   ?   Pain Loc   ?   Pain Edu?   ?   Excl. in [place]?   ? ?No data found. ? ?Updated Vital Signs ?BP 120/71 (BP Location: Left Arm)   Pulse 66   Temp 98.3 ?F (36.8 ?C) (Oral)   Resp 16   SpO2 97%  ?   ? ?  Physical Exam ?Vitals and nursing note reviewed.  ?Constitutional:   ?   General: He is not in acute distress. ?   Appearance: Normal appearance. He is not ill-appearing.  ?HENT:  ?   Head: Normocephalic and atraumatic.  ?Eyes:  ?   Conjunctiva/sclera: Conjunctivae normal.  ?Cardiovascular:  ?   Rate and Rhythm: Normal rate.  ?Pulmonary:  ?   Effort: Pulmonary effort is normal.  ?Skin: ?   Comments: Few lesions to hands compared to prior, some improvement noted  ?Neurological:  ?   Mental Status: He is alert.  ?Psychiatric:     ?   Mood and Affect: Mood normal.     ?   Behavior: Behavior normal.     ?   Thought Content: Thought content normal.  ? ? ? ?UC Treatments / Results  ?Labs ?(all labs ordered are listed, but only abnormal results are displayed) ?Labs Reviewed - No data to display ? ?EKG ? ? ?Radiology ?No results found. ? ?Procedures ?Procedures (including critical care time) ? ?Medications Ordered in UC ?Medications - No data to display ? ?Initial Impression / Assessment and Plan / UC Course  ?I have reviewed the triage vital signs  and the nursing notes. ? ?Pertinent labs & imaging results that were available during my care of the patient were reviewed by me and considered in my medical decision making (see chart for details). ? ?  ?Will trial oral steroids and note provided for work for short period of time. Encouraged follow up with any further concerns.  ? ?Final Clinical Impressions(s) / UC Diagnoses  ? ?Final diagnoses:  ?Dermatitis  ? ?Discharge Instructions   ?None ?  ? ?ED Prescriptions   ? ? Medication Sig Dispense Auth. Provider  ? predniSONE (DELTASONE) 20 MG tablet Take 2 tablets (40 mg total) by mouth daily with breakfast for 5 days. 10 tablet Francene Finders, PA-C  ? ?  ? ?PDMP not reviewed this encounter. ?  ?Francene Finders, PA-C ?04/30/21 1640 ? ?

## 2021-05-14 ENCOUNTER — Ambulatory Visit
Admission: EM | Admit: 2021-05-14 | Discharge: 2021-05-14 | Disposition: A | Discharge status: Home / Self Care | Payer: PRIVATE HEALTH INSURANCE | Attending: Physician Assistant | Admitting: Physician Assistant

## 2021-05-14 DIAGNOSIS — J069 Acute upper respiratory infection, unspecified: Secondary | ICD-10-CM | POA: Diagnosis not present

## 2021-05-14 NOTE — ED Provider Notes (Signed)
?[name] ? ? ? ?CSN: [id number] ?Arrival date & time: 05/14/21  1732 ? ? ?  ? ?History   ?Chief Complaint ?Chief Complaint  ?Patient presents with  ? Generalized Body Aches  ? ? ?HPI ?Tim Ford is a 36 y.o. male.  ? ?Patient here today for evaluation of congestion and body aches that started 3 days ago. He has had mild cough. He has not had fever. He denies any nausea, vomiting or diarrhea. He has been taking OTC meds without resolution of symptoms. ? ?The history is provided by the patient.  ? ?History reviewed. No pertinent past medical history. ? ?There are no problems to display for this patient. ? ? ?History reviewed. No pertinent surgical history. ? ? ? ? ?Home Medications   ? ?Prior to Admission medications   ?Medication Sig Start Date End Date Taking? Authorizing Provider  ?acetaminophen (TYLENOL) 325 MG tablet Take 650 mg by mouth every 6 (six) hours as needed for moderate pain.    [provider]  ?benzonatate (TESSALON) 100 MG capsule Take 1 capsule (100 mg total) by mouth every 8 (eight) hours as needed for cough. 01/16/21   Gustavus Bryant, FNP  ?ibuprofen (ADVIL,MOTRIN) 200 MG tablet Take 400 mg by mouth every 6 (six) hours as needed for headache, mild pain or moderate pain.    [provider]  ?loratadine (CLARITIN) 10 MG tablet Take 10 mg by mouth daily as needed for allergies.    [provider]  ?triamcinolone cream (KENALOG) 0.1 % Apply 1 application. topically 2 (two) times daily. 04/25/21   Tomi Bamberger, PA-C  ?metoCLOPramide (REGLAN) 10 MG tablet Take 1 tablet (10 mg total) by mouth every 6 (six) hours as needed for nausea (or headache). ?Patient not taking: Reported on 06/22/2017 09/01/16 03/16/19  Dione Booze, MD  ? ? ?Family History ?Family History  ?Problem Relation Age of Onset  ? Diabetes Other   ? Schizophrenia Mother   ? ? ?Social History ?Social History  ? ?Tobacco Use  ? Smoking status: Some Days  ? Smokeless tobacco: Never  ?Substance Use  Topics  ? Alcohol use: Yes  ?  Comment: occ  ? Drug use: No  ? ? ? ?Allergies   ?Coconut oil, Peanuts [peanut oil], and Sesame seed [sesame oil] ? ? ?Review of Systems ?Review of Systems  ?Constitutional:  Negative for chills and fever.  ?HENT:  Positive for congestion. Negative for ear pain and sore throat.   ?Eyes:  Negative for discharge and redness.  ?Respiratory:  Positive for cough. Negative for shortness of breath.   ?Gastrointestinal:  Negative for abdominal pain, nausea and vomiting.  ?Musculoskeletal:  Positive for myalgias.  ? ? ?Physical Exam ?Triage Vital Signs ?ED Triage Vitals  ?Enc Vitals Group  ?   BP 05/14/21 1851 127/75  ?   Pulse Rate 05/14/21 1851 66  ?   Resp 05/14/21 1851 18  ?   Temp 05/14/21 1851 98.1 ?F (36.7 ?C)  ?   Temp src --   ?   SpO2 05/14/21 1851 98 %  ?   Weight 05/14/21 1850 165 lb (74.8 kg)  ?   Height 05/14/21 1850 5\' 11"  (1.803 m)  ?   Head Circumference --   ?   Peak Flow --   ?   Pain Score 05/14/21 1849 4  ?   Pain Loc --   ?   Pain Edu? --   ?   Excl. in  GC? --   ? ?No data found. ? ?Updated Vital Signs ?BP 127/75 (BP Location: Left Arm)   Pulse 66   Temp 98.1 ?F (36.7 ?C)   Resp 18   Ht 5\' 11"  (1.803 m)   Wt 165 lb (74.8 kg)   SpO2 98%   BMI 23.01 kg/m?  ? ?Physical Exam ?Vitals and nursing note reviewed.  ?Constitutional:   ?   General: He is not in acute distress. ?   Appearance: Normal appearance. He is not ill-appearing.  ?HENT:  ?   Head: Normocephalic and atraumatic.  ?   Nose: Congestion present.  ?   Mouth/Throat:  ?   Mouth: Mucous membranes are moist.  ?   Pharynx: Oropharynx is clear. No oropharyngeal exudate or posterior oropharyngeal erythema.  ?Eyes:  ?   Conjunctiva/sclera: Conjunctivae normal.  ?Cardiovascular:  ?   Rate and Rhythm: Normal rate and regular rhythm.  ?   Heart sounds: Normal heart sounds. No murmur heard. ?Pulmonary:  ?   Effort: Pulmonary effort is normal. No respiratory distress.  ?   Breath sounds: Normal breath sounds. No wheezing,  rhonchi or rales.  ?Skin: ?   General: Skin is warm and dry.  ?Neurological:  ?   Mental Status: He is alert.  ?Psychiatric:     ?   Mood and Affect: Mood normal.     ?   Thought Content: Thought content normal.  ? ? ? ?UC Treatments / Results  ?Labs ?(all labs ordered are listed, but only abnormal results are displayed) ?Labs Reviewed  ?COVID-19, FLU A+B NAA  ? ? ?EKG ? ? ?Radiology ?No results found. ? ?Procedures ?Procedures (including critical care time) ? ?Medications Ordered in UC ?Medications - No data to display ? ?Initial Impression / Assessment and Plan / UC Course  ?I have reviewed the triage vital signs and the nursing notes. ? ?Pertinent labs & imaging results that were available during my care of the patient were reviewed by me and considered in my medical decision making (see chart for details). ? ?  ?Suspect viral etiology of symptoms. Will screen for covid and flu. Encouraged symptomatic treatment, increased fluids and rest. Recommend follow up with any further concerns.  ? ?Final Clinical Impressions(s) / UC Diagnoses  ? ?Final diagnoses:  ?Acute upper respiratory infection  ? ?Discharge Instructions   ?None ?  ? ?ED Prescriptions   ?None ?  ? ?PDMP not reviewed this encounter. ?  ? , PA-C ?05/15/21 0825 ? ?

## 2021-05-14 NOTE — ED Triage Notes (Signed)
Pt presents with congestion and body aches for 3 days. Pt has been using otc cold medications. Declines fevers.  ? ?

## 2021-05-15 ENCOUNTER — Encounter: Payer: Self-pay | Admitting: Physician Assistant

## 2021-05-15 ENCOUNTER — Telehealth: Payer: Self-pay | Admitting: Physician Assistant

## 2021-05-15 NOTE — Telephone Encounter (Signed)
Patient here today because labs are not available at this time- will extend work note.  ?

## 2021-05-16 LAB — COVID-19, FLU A+B NAA
Influenza A, NAA: NOT DETECTED
Influenza B, NAA: NOT DETECTED
SARS-CoV-2, NAA: NOT DETECTED

## 2023-01-27 ENCOUNTER — Ambulatory Visit
Admission: EM | Admit: 2023-01-27 | Discharge: 2023-01-27 | Disposition: A | Discharge status: Home / Self Care | Payer: Self-pay | Attending: Family Medicine | Admitting: Family Medicine

## 2023-01-27 DIAGNOSIS — K047 Periapical abscess without sinus: Secondary | ICD-10-CM

## 2023-01-27 MED ORDER — KETOROLAC TROMETHAMINE 30 MG/ML IJ SOLN
30.0000 mg | Freq: Once | INTRAMUSCULAR | Status: AC
  Administered 2023-01-27: 30 mg via INTRAMUSCULAR

## 2023-01-27 MED ORDER — KETOROLAC TROMETHAMINE 10 MG PO TABS: 10.0000 mg | ORAL_TABLET | Freq: Four times a day (QID) | ORAL | 0 refills | Status: AC | PRN

## 2023-01-27 MED ORDER — AMOXICILLIN-POT CLAVULANATE 875-125 MG PO TABS: 1.0000 | ORAL_TABLET | Freq: Two times a day (BID) | ORAL | 0 refills | Status: AC

## 2023-01-27 NOTE — Discharge Instructions (Addendum)
You have been given a shot of Toradol 30 mg today.  Ketorolac 10 mg tablets--take 1 tablet every 6 hours as needed for pain.  This is the same medicine that is in the shot we just gave you  Take amoxicillin-clavulanate 875 mg--1 tab twice daily with food for 7 days  If you are not improved at all with the treatments provided or if you worsen, please go to the emergency room  Please try to see a dentist soon

## 2023-01-27 NOTE — ED Triage Notes (Addendum)
Patient presents with abscess on left side of face, soreness, headache, swelling and growth x 4 days. Treated Tylenol and heating patching.

## 2023-01-27 NOTE — ED Provider Notes (Signed)
[name]    CSN: [id number] Arrival date & time: 01/27/23  1433      History   Chief Complaint No chief complaint on file.   HPI Tim Ford is a 37 y.o. male.   HPI Here for pain and swelling of his left upper lip and cheek and nose.  About 5 days ago he had severe headache for which she was seen in urgent care where he was working in [place].  Evaluation was negative.  The pain was not relieved by the treatments provided.  Then about 2 days later he started having more pain and swelling around his left upper lip and nose.  No allergies to medications  No fever or vomiting    History reviewed. No pertinent past medical history.  There are no active problems to display for this patient.   History reviewed. No pertinent surgical history.     Home Medications    Prior to Admission medications   Medication Sig Start Date End Date Taking? Authorizing Provider  amoxicillin-clavulanate (AUGMENTIN) 875-125 MG tablet Take 1 tablet by mouth 2 (two) times daily for 7 days. 01/27/23 02/03/23 Yes Zenia Resides, MD  ketorolac (TORADOL) 10 MG tablet Take 1 tablet (10 mg total) by mouth every 6 (six) hours as needed (pain). 01/27/23  Yes Zenia Resides, MD  acetaminophen (TYLENOL) 325 MG tablet Take 650 mg by mouth every 6 (six) hours as needed for moderate pain.    [provider]  loratadine (CLARITIN) 10 MG tablet Take 10 mg by mouth daily as needed for allergies.    [provider]  triamcinolone cream (KENALOG) 0.1 % Apply 1 application. topically 2 (two) times daily. 04/25/21   Tomi Bamberger, PA-C  metoCLOPramide (REGLAN) 10 MG tablet Take 1 tablet (10 mg total) by mouth every 6 (six) hours as needed for nausea (or headache). Patient not taking: Reported on 06/22/2017 09/01/16 03/16/19  Dione Booze, MD    Family History Family History  Problem Relation Age of Onset   Diabetes Other    Schizophrenia Mother     Social  History Social History   Tobacco Use   Smoking status: Some Days    Types: Cigars   Smokeless tobacco: Never  Substance Use Topics   Alcohol use: Yes    Comment: occ   Drug use: No     Allergies   Coconut (cocos nucifera), Peanuts [peanut oil], and Sesame seed [sesame oil]   Review of Systems Review of Systems   Physical Exam Triage Vital Signs ED Triage Vitals  Encounter Vitals Group     BP 01/27/23 1725 135/77     Systolic BP Percentile --      Diastolic BP Percentile --      Pulse Rate 01/27/23 1725 64     Resp 01/27/23 1725 16     Temp 01/27/23 1725 98.6 F (37 C)     Temp Source 01/27/23 1725 Oral     SpO2 01/27/23 1725 98 %     Weight 01/27/23 1723 175 lb (79.4 kg)     Height 01/27/23 1723 6' (1.829 m)     Head Circumference --      Peak Flow --      Pain Score 01/27/23 1723 6     Pain Loc --      Pain Education --      Exclude from Growth Chart --    No data found.  Updated Vital Signs BP  135/77 (BP Location: Left Arm)   Pulse 64   Temp 98.6 F (37 C) (Oral)   Resp 16   Ht 6' (1.829 m)   Wt 79.4 kg   SpO2 98%   BMI 23.73 kg/m   Visual Acuity Right Eye Distance:   Left Eye Distance:   Bilateral Distance:    Right Eye Near:   Left Eye Near:    Bilateral Near:     Physical Exam Vitals reviewed.  Constitutional:      General: He is not in acute distress.    Appearance: He is not ill-appearing, toxic-appearing or diaphoretic.  HENT:     Head:     Comments: There is swelling of his left cheek near the nose and the left upper lip.  There is some firm induration about 2 cm in diameter just adjacent to the left nostril.  No fluctuance.    Mouth/Throat:     Mouth: Mucous membranes are moist.     Pharynx: No oropharyngeal exudate.     Comments: There is swelling of the gums laterally on the left upper dental ridge.  No drainage noted in the mouth Eyes:     Extraocular Movements: Extraocular movements intact.     Conjunctiva/sclera:  Conjunctivae normal.     Pupils: Pupils are equal, round, and reactive to light.  Musculoskeletal:     Cervical back: Neck supple.  Lymphadenopathy:     Cervical: No cervical adenopathy.  Skin:    Coloration: Skin is not pale.  Neurological:     General: No focal deficit present.     Mental Status: He is alert and oriented to person, place, and time.  Psychiatric:        Behavior: Behavior normal.      UC Treatments / Results  Labs (all labs ordered are listed, but only abnormal results are displayed) Labs Reviewed - No data to display  EKG   Radiology No results found.  Procedures Procedures (including critical care time)  Medications Ordered in UC Medications  ketorolac (TORADOL) 30 MG/ML injection 30 mg (has no administration in time range)    Initial Impression / Assessment and Plan / UC Course  I have reviewed the triage vital signs and the nursing notes.  Pertinent labs & imaging results that were available during my care of the patient were reviewed by me and considered in my medical decision making (see chart for details).     He states he is going to try to access dental care while he is home for the holidays and he has dental insurance.  Toradol injection is given here and Toradol tablets are sent to the pharmacy for pain.  Augmentin is sent in for the infection and cellulitis.  If he is worsening anyway he is to proceed to the emergency room  Final Clinical Impressions(s) / UC Diagnoses   Final diagnoses:  Dental infection     Discharge Instructions      You have been given a shot of Toradol 30 mg today.  Ketorolac 10 mg tablets--take 1 tablet every 6 hours as needed for pain.  This is the same medicine that is in the shot we just gave you  Take amoxicillin-clavulanate 875 mg--1 tab twice daily with food for 7 days  If you are not improved at all with the treatments provided or if you worsen, please go to the emergency room  Please try  to see a dentist soon     ED Prescriptions  Medication Sig Dispense Auth. Provider   amoxicillin-clavulanate (AUGMENTIN) 875-125 MG tablet Take 1 tablet by mouth 2 (two) times daily for 7 days. 14 tablet Nasser Ku, Janace Aris, MD   ketorolac (TORADOL) 10 MG tablet Take 1 tablet (10 mg total) by mouth every 6 (six) hours as needed (pain). 20 tablet Carole Deere, Janace Aris, MD      PDMP not reviewed this encounter.   Zenia Resides, MD 01/27/23 [phone]
